# Patient Record
Sex: Male | Born: 1960 | Race: White | Hispanic: No | Marital: Married | Smoking: Never smoker
Health system: Southern US, Community
[De-identification: ages and names within clinical notes are randomized; demographics above are authoritative.]

## PROBLEM LIST (undated history)

## (undated) DIAGNOSIS — I10 Essential (primary) hypertension: Secondary | ICD-10-CM

## (undated) DIAGNOSIS — J302 Other seasonal allergic rhinitis: Secondary | ICD-10-CM

## (undated) DIAGNOSIS — G4730 Sleep apnea, unspecified: Secondary | ICD-10-CM

## (undated) DIAGNOSIS — K219 Gastro-esophageal reflux disease without esophagitis: Secondary | ICD-10-CM

## (undated) HISTORY — DX: Gastro-esophageal reflux disease without esophagitis: K21.9

## (undated) HISTORY — PX: KNEE SURGERY: SHX244

## (undated) HISTORY — PX: LIPOMA EXCISION: SHX5283

## (undated) HISTORY — DX: Other seasonal allergic rhinitis: J30.2

## (undated) HISTORY — DX: Essential (primary) hypertension: I10

## (undated) HISTORY — PX: ROTATOR CUFF REPAIR: SHX139

## (undated) HISTORY — DX: Sleep apnea, unspecified: G47.30

---

## 2004-11-02 ENCOUNTER — Other Ambulatory Visit: Admission: RE | Admit: 2004-11-02 | Discharge: 2004-11-02 | Payer: Self-pay | Admitting: Dermatology

## 2005-01-05 ENCOUNTER — Emergency Department (HOSPITAL_COMMUNITY): Admission: EM | Admit: 2005-01-05 | Discharge: 2005-01-06 | Payer: Self-pay | Admitting: Emergency Medicine

## 2005-04-20 ENCOUNTER — Emergency Department (HOSPITAL_COMMUNITY): Admission: EM | Admit: 2005-04-20 | Discharge: 2005-04-20 | Payer: Self-pay | Admitting: Emergency Medicine

## 2007-04-21 ENCOUNTER — Ambulatory Visit: Payer: Self-pay | Admitting: Gastroenterology

## 2007-04-21 LAB — CONVERTED CEMR LAB
ALT: 23 units/L (ref 0–53)
Basophils Absolute: 0.1 10*3/uL (ref 0.0–0.1)
CO2: 31 meq/L (ref 19–32)
Calcium: 9.1 mg/dL (ref 8.4–10.5)
Chloride: 109 meq/L (ref 96–112)
Eosinophils Absolute: 0.1 10*3/uL (ref 0.0–0.6)
GFR calc Af Amer: 93 mL/min
MCHC: 35 g/dL (ref 30.0–36.0)
MCV: 89.8 fL (ref 78.0–100.0)
Neutrophils Relative %: 63.8 % (ref 43.0–77.0)
RBC: 4.44 M/uL (ref 4.22–5.81)
WBC: 5.9 10*3/uL (ref 4.5–10.5)

## 2007-05-27 ENCOUNTER — Ambulatory Visit: Payer: Self-pay | Admitting: Gastroenterology

## 2007-10-28 DIAGNOSIS — T7840XA Allergy, unspecified, initial encounter: Secondary | ICD-10-CM | POA: Insufficient documentation

## 2007-10-28 DIAGNOSIS — K219 Gastro-esophageal reflux disease without esophagitis: Secondary | ICD-10-CM

## 2010-12-19 NOTE — Letter (Signed)
April 21, 2007    Mr. Daniel Giles   RE:  HASHIM, EICHHORST  MRN:  [id number]  /  DOB:  1960/11/11   Dear Mr. Daniel Giles:   It is my pleasure to have treated you recently as a new patient in my  office.  I appreciate your confidence and the opportunity to participate  in your care.   Since I do have a busy inpatient endoscopy schedule and office schedule,  my office hours vary weekly.  I am, however, available for emergency  calls every day through my office.  If I cannot promptly meet an urgent  office appointment, another one of our gastroenterologists will be able  to assist you.   My well-trained staff are prepared to help you at all times.  For  emergencies after office hours, a physician from our gastroenterology  section is always available through my 24-hour answering service.   While you are under my care, I encourage discussion of your questions  and concerns, and I will be happy to return your calls as soon as I am  available.   Once again, I welcome you as a new patient and I look forward to a happy  and healthy relationship.    Sincerely,      Barbette Hair. Arlyce Dice, MD,FACG  Electronically Signed   RDK/MedQ  DD: 04/21/2007  DT: 04/21/2007  Job #: [id number]

## 2010-12-19 NOTE — Assessment & Plan Note (Signed)
[facility]                         GASTROENTEROLOGY OFFICE NOTE   NAME:Vila, BASEL DEFALCO                      MRN:          [id number]  DATE:04/21/2007                            DOB:          Sep 11, 1960    PROBLEM:  Erratic bowel habits.   Mr. Gowdy is a 50 year old white male self-referred for evaluation.  Over the past year, he has had very erratic bowels consisting of  constipation followed by days of diarrhea. With diarrhea, he has severe  crampy lower abdominal pain. There is no history of melena or  hematochezia. Prior to a year ago, his bowels were normal. He also has  very frequent pyrosis.   FAMILY HISTORY:  Is pertinent for a sister who has Crohn's disease.  Father had colon polyps.   PAST MEDICAL HISTORY:  Is unremarkable.   FAMILY HISTORY:  Is as stated above.   CURRENT MEDICATIONS:  He takes Zantac daily.   ALLERGIES:  No allergies.   SOCIAL HISTORY:  He neither smokes nor drinks. He is married and works  as a Curator.   REVIEW OF SYSTEMS:  Was reviewed and is negative.   PHYSICAL EXAMINATION:  Pulse 76, blood pressure 110/66, weight 186.  HEENT: EOMI.  PERRLA.  Sclerae are anicteric.  Conjunctivae are pink.  NECK:  Supple without thyromegaly, adenopathy or carotid bruits.  CHEST:  Clear to auscultation and percussion without adventitious  sounds.  CARDIAC:  Regular rhythm; normal S1 S2.  There are no murmurs, gallops  or rubs.  ABDOMEN:  Bowel sounds are normoactive.  Abdomen is soft, nontender and  nondistended.  There are no abdominal masses, tenderness, splenic  enlargement or hepatomegaly.  EXTREMITIES:  Full range of motion.  No cyanosis, clubbing or edema.  RECTAL:  Deferred.   IMPRESSION:  1. Change in bowel habits. Inflammatory bowel disease is a concern.      Neoplasm is less likely. Finally, this may be irritable bowel      syndrome.  2. Gastroesophageal reflux disease.  3. Family history of Crohn's  disease.   RECOMMENDATION:  1. Colonoscopy. If negative, I will work on bowel regimen.  2. Begin Prevacid 30 mg a day.     Barbette Hair. Arlyce Dice, MD,FACG  Electronically Signed    RDK/MedQ  DD: 04/21/2007  DT: 04/21/2007  Job #: [id number]

## 2015-11-28 ENCOUNTER — Ambulatory Visit (INDEPENDENT_AMBULATORY_CARE_PROVIDER_SITE_OTHER): Payer: BLUE CROSS/BLUE SHIELD | Admitting: Primary Care

## 2015-11-28 ENCOUNTER — Encounter: Payer: Self-pay | Admitting: Primary Care

## 2015-11-28 VITALS — BP 152/104 | HR 61 | Temp 98.1°F | Ht 70.0 in | Wt 195.4 lb

## 2015-11-28 DIAGNOSIS — I10 Essential (primary) hypertension: Secondary | ICD-10-CM | POA: Insufficient documentation

## 2015-11-28 DIAGNOSIS — K219 Gastro-esophageal reflux disease without esophagitis: Secondary | ICD-10-CM

## 2015-11-28 MED ORDER — PANTOPRAZOLE SODIUM 40 MG PO TBEC: 40.0000 mg | DELAYED_RELEASE_TABLET | Freq: Every day | ORAL | Status: DC

## 2015-11-28 NOTE — Assessment & Plan Note (Signed)
Above goal in clinic, improved to 140/90 upon recheck. He is compliant to his medications. Managed on irbesartan 300 mg and is at max dose. Will have him check BP at home for 2 weeks. If BP above goal, then will add HCTZ.  Asymptomatic.

## 2015-11-28 NOTE — Assessment & Plan Note (Signed)
Long history of. Managed on pantoprazole 40 mg.  Will consider reducing dose to 20 mg next refill to see if he can tolerate.

## 2015-11-28 NOTE — Progress Notes (Signed)
   Subjective:    Patient ID: Daniel Giles, male    DOB: 1960/11/17, 55 y.o.   MRN: [id number]  HPI  Daniel Giles is a 55 year old male who presents today to establish care and discuss the problems mentioned below. Will obtain old records. His last physical was over 1 year ago.   1) Essential Hypertension: Diagnosed 2-3 years ago. Currently managed on irbesartan 300 mg. He does not currently monitor his BP at home. His BP is above goal in the office today, improved on recheck. Denies headaches, dizziness, chest pain.   2) GERD: Currently managed on pantoprazole 40 mg. He will experience belching, increased abdominal bloating and gas, and reflux of gastric content. Denies symptoms when on medication. He is aware of some trigger foods for GERD.   Review of Systems  Respiratory: Negative for shortness of breath.   Cardiovascular: Negative for chest pain.  Gastrointestinal:       GERD  Neurological: Negative for dizziness and headaches.       Past Medical History  Diagnosis Date  . GERD (gastroesophageal reflux disease)   . Essential hypertension   . Seasonal allergies      Social History   Social History  . Marital Status: Married    Spouse Name: N/A  . Number of Children: N/A  . Years of Education: N/A   Occupational History  . Not on file.   Social History Main Topics  . Smoking status: Never Smoker   . Smokeless tobacco: Not on file  . Alcohol Use: No  . Drug Use: No  . Sexual Activity: Not on file   Other Topics Concern  . Not on file   Social History Narrative   Married.   2 children, 4 grandchildren.   Works as a Dealer.   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       Past Surgical History  Procedure Laterality Date  . Rotator cuff repair Right   . Knee surgery Bilateral   . Lipoma excision      oral cavity    Family History  Problem Relation Age of Onset  . Hypertension Father   . Stroke Paternal Grandmother   . Diabetes  Brother     Type 1    Allergies  Allergen Reactions  . Codeine Itching    No current outpatient prescriptions on file prior to visit.   No current facility-administered medications on file prior to visit.    BP 152/104 mmHg  Pulse 61  Temp(Src) 98.1 F (36.7 C) (Oral)  Ht 5\' 10"  (1.778 m)  Wt 195 lb 6.4 oz (88.633 kg)  BMI 28.04 kg/m2  SpO2 97%    Objective:   Physical Exam  Constitutional: He is oriented to person, place, and time. He appears well-nourished.  Neck: Neck supple.  Cardiovascular: Normal rate and regular rhythm.   Pulmonary/Chest: Effort normal and breath sounds normal. He has no wheezes. He has no rales.  Neurological: He is alert and oriented to person, place, and time.  Skin: Skin is warm and dry.  Psychiatric: He has a normal mood and affect.          Assessment & Plan:

## 2015-11-28 NOTE — Progress Notes (Signed)
Pre visit review using our clinic review tool, if applicable. No additional management support is needed unless otherwise documented below in the visit note. 

## 2015-11-28 NOTE — Patient Instructions (Addendum)
Your blood pressure is too high today at 152/104 and 140/90 on recheck. Your blood pressure should be less than 140/90.  Check your blood pressure daily, around the same time of day, for the next 2 weeks.   Ensure that you have rested for 30 minutes prior to checking your blood pressure. Record your readings and bring them to your next visit.  I've sent a refill for the Pantoprazole medication to your pharmacy.   Please schedule a physical with me at your convenience. You may also schedule a lab only appointment 3-4 days prior. We will discuss your lab results in detail during your physical.  It was a pleasure to meet you today! Please don't hesitate to call me with any questions. Welcome to [facility]!  Food Choices for Gastroesophageal Reflux Disease, Adult When you have gastroesophageal reflux disease (GERD), the foods you eat and your eating habits are very important. Choosing the right foods can help ease the discomfort of GERD. WHAT GENERAL GUIDELINES DO I NEED TO FOLLOW?  Choose fruits, vegetables, whole grains, low-fat dairy products, and low-fat meat, fish, and poultry.  Limit fats such as oils, salad dressings, butter, nuts, and avocado.  Keep a food diary to identify foods that cause symptoms.  Avoid foods that cause reflux. These may be different for different people.  Eat frequent small meals instead of three large meals each day.  Eat your meals slowly, in a relaxed setting.  Limit fried foods.  Cook foods using methods other than frying.  Avoid drinking alcohol.  Avoid drinking large amounts of liquids with your meals.  Avoid bending over or lying down until 2-3 hours after eating. WHAT FOODS ARE NOT RECOMMENDED? The following are some foods and drinks that may worsen your symptoms: Vegetables Tomatoes. Tomato juice. Tomato and spaghetti sauce. Chili peppers. Onion and garlic. Horseradish. Fruits Oranges, grapefruit, and lemon (fruit and juice). Meats High-fat  meats, fish, and poultry. This includes hot dogs, ribs, ham, sausage, salami, and bacon. Dairy Whole milk and chocolate milk. Sour cream. Cream. Butter. Ice cream. Cream cheese.  Beverages Coffee and tea, with or without caffeine. Carbonated beverages or energy drinks. Condiments Hot sauce. Barbecue sauce.  Sweets/Desserts Chocolate and cocoa. Donuts. Peppermint and spearmint. Fats and Oils High-fat foods, including [place] fries and potato chips. Other Vinegar. Strong spices, such as black pepper, white pepper, red pepper, cayenne, curry powder, cloves, ginger, and chili powder. The items listed above may not be a complete list of foods and beverages to avoid. Contact your dietitian for more information.   This information is not intended to replace advice given to you by your health care provider. Make sure you discuss any questions you have with your health care provider.   Document Released: 07/23/2005 Document Revised: 08/13/2014 Document Reviewed: 05/27/2013 Elsevier Interactive Patient Education [employer].

## 2015-12-12 ENCOUNTER — Telehealth: Payer: Self-pay | Admitting: Primary Care

## 2015-12-12 NOTE — Telephone Encounter (Signed)
-----   Message from Pleas Koch, NP sent at 11/28/2015  2:34 PM EDT ----- Regarding: BP Please check on Daniel Giles blood pressure readings as he was to check his BP over the past 2 weeks.

## 2015-12-13 NOTE — Telephone Encounter (Signed)
Message left for patient to return my call.  

## 2015-12-15 NOTE — Telephone Encounter (Signed)
Left message on patient's voicemail to return call

## 2015-12-20 NOTE — Telephone Encounter (Signed)
Message left for patient to return my call.  

## 2016-02-17 ENCOUNTER — Other Ambulatory Visit: Payer: Self-pay | Admitting: Primary Care

## 2016-02-17 DIAGNOSIS — I10 Essential (primary) hypertension: Secondary | ICD-10-CM

## 2016-02-17 DIAGNOSIS — Z0000 Encounter for general adult medical examination without abnormal findings: Secondary | ICD-10-CM

## 2016-02-27 ENCOUNTER — Other Ambulatory Visit (INDEPENDENT_AMBULATORY_CARE_PROVIDER_SITE_OTHER): Payer: BLUE CROSS/BLUE SHIELD

## 2016-02-27 DIAGNOSIS — Z0000 Encounter for general adult medical examination without abnormal findings: Secondary | ICD-10-CM

## 2016-02-27 DIAGNOSIS — R7989 Other specified abnormal findings of blood chemistry: Secondary | ICD-10-CM | POA: Diagnosis not present

## 2016-02-27 DIAGNOSIS — I10 Essential (primary) hypertension: Secondary | ICD-10-CM

## 2016-02-27 LAB — COMPREHENSIVE METABOLIC PANEL
ALT: 19 U/L (ref 0–53)
AST: 15 U/L (ref 0–37)
Albumin: 4.3 g/dL (ref 3.5–5.2)
Alkaline Phosphatase: 64 U/L (ref 39–117)
BUN: 16 mg/dL (ref 6–23)
CALCIUM: 9.4 mg/dL (ref 8.4–10.5)
CHLORIDE: 105 meq/L (ref 96–112)
CO2: 31 meq/L (ref 19–32)
Creatinine, Ser: 1.29 mg/dL (ref 0.40–1.50)
GFR: 61.34 mL/min (ref >60.00)
Glucose, Bld: 109 mg/dL — ABNORMAL HIGH (ref 70–99)
Potassium: 4.3 mEq/L (ref 3.5–5.1)
Sodium: 142 mEq/L (ref 135–145)
Total Bilirubin: 0.5 mg/dL (ref 0.2–1.2)
Total Protein: 6.5 g/dL (ref 6.0–8.3)

## 2016-02-27 LAB — PSA: PSA: 0.26 ng/mL (ref 0.10–4.00)

## 2016-02-27 LAB — LIPID PANEL
CHOL/HDL RATIO: 6
CHOLESTEROL: 206 mg/dL — AB (ref 0–200)
HDL: 34.4 mg/dL — ABNORMAL LOW (ref >39.00)
NonHDL: 172.05
TRIGLYCERIDES: 236 mg/dL — AB (ref 0.0–149.0)
VLDL: 47.2 mg/dL — AB (ref 0.0–40.0)

## 2016-02-27 LAB — LDL CHOLESTEROL, DIRECT: Direct LDL: 139 mg/dL

## 2016-02-27 LAB — HEMOGLOBIN A1C: Hgb A1c MFr Bld: 5.5 % (ref 4.6–6.5)

## 2016-03-01 ENCOUNTER — Ambulatory Visit (INDEPENDENT_AMBULATORY_CARE_PROVIDER_SITE_OTHER): Payer: BLUE CROSS/BLUE SHIELD | Admitting: Primary Care

## 2016-03-01 ENCOUNTER — Encounter: Payer: Self-pay | Admitting: Primary Care

## 2016-03-01 VITALS — BP 130/86 | HR 54 | Temp 97.4°F | Ht 70.0 in | Wt 188.1 lb

## 2016-03-01 DIAGNOSIS — I10 Essential (primary) hypertension: Secondary | ICD-10-CM

## 2016-03-01 DIAGNOSIS — K219 Gastro-esophageal reflux disease without esophagitis: Secondary | ICD-10-CM | POA: Diagnosis not present

## 2016-03-01 DIAGNOSIS — E785 Hyperlipidemia, unspecified: Secondary | ICD-10-CM

## 2016-03-01 DIAGNOSIS — Z0000 Encounter for general adult medical examination without abnormal findings: Secondary | ICD-10-CM | POA: Insufficient documentation

## 2016-03-01 DIAGNOSIS — Z0001 Encounter for general adult medical examination with abnormal findings: Secondary | ICD-10-CM | POA: Insufficient documentation

## 2016-03-01 MED ORDER — RANITIDINE HCL 150 MG PO TABS: 150.0000 mg | ORAL_TABLET | Freq: Every day | ORAL | 3 refills | Status: DC

## 2016-03-01 NOTE — Progress Notes (Signed)
Subjective:    Patient ID: Daniel Giles, male    DOB: [birth year], 55 y.o.   MRN: [id number]  HPI  Daniel Giles is a 55 year old male who presents today for complete physical.  Immunizations: -Tetanus: Unsure, believes it's been over 10 years -Influenza: Did receive last season  Diet: He endorses a fair diet Breakfast: Egg biscuit Lunch: Meat, vegetables, potatoes  Dinner: Meat, vegetables, potatoes Snacks: Occasionally Desserts: Occasionally  Beverages: Water, soda  Exercise: He does not currently exercise Eye exam: Completed 2 years ago, due Dental exam: Completes semi-annually Colonoscopy: Completed in 2008, normal.   Review of Systems  Constitutional: Negative for unexpected weight change.  HENT: Negative for rhinorrhea.   Respiratory: Negative for cough and shortness of breath.   Cardiovascular: Negative for chest pain.  Gastrointestinal: Negative for constipation and diarrhea.  Genitourinary: Negative for difficulty urinating.  Musculoskeletal: Negative for arthralgias and myalgias.  Skin: Negative for rash.  Allergic/Immunologic: Positive for environmental allergies.  Neurological: Negative for dizziness, numbness and headaches.  Psychiatric/Behavioral:       Denies concerns for anxiety and depression       Past Medical History:  Diagnosis Date  . Essential hypertension   . GERD (gastroesophageal reflux disease)   . Seasonal allergies      Social History   Social History  . Marital status: Married    Spouse name: N/A  . Number of children: N/A  . Years of education: N/A   Occupational History  . Not on file.   Social History Main Topics  . Smoking status: Never Smoker  . Smokeless tobacco: Not on file  . Alcohol use No  . Drug use: No  . Sexual activity: Not on file   Other Topics Concern  . Not on file   Social History Narrative   Married.   2 children, 4 grandchildren.   Works as a Dealer.   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       Past Surgical History:  Procedure Laterality Date  . KNEE SURGERY Bilateral   . LIPOMA EXCISION     oral cavity  . ROTATOR CUFF REPAIR Right     Family History  Problem Relation Age of Onset  . Hypertension Father   . Stroke Paternal Grandmother   . Diabetes Brother     Type 1    Allergies  Allergen Reactions  . Codeine Itching    Current Outpatient Prescriptions on File Prior to Visit  Medication Sig Dispense Refill  . irbesartan (AVAPRO) 300 MG tablet Take 300 mg by mouth daily.     No current facility-administered medications on file prior to visit.     BP 130/86 (BP Location: Right Arm, Patient Position: Sitting, Cuff Size: Normal)   Pulse (!) 54   Temp 97.4 F (36.3 C) (Oral)   Ht 5\' 10"  (1.778 m)   Wt 188 lb 1.9 oz (85.3 kg)   SpO2 98%   BMI 26.99 kg/m    Objective:   Physical Exam  Constitutional: He is oriented to person, place, and time. He appears well-nourished.  HENT:  Right Ear: Tympanic membrane and ear canal normal.  Left Ear: Tympanic membrane and ear canal normal.  Nose: Nose normal. Right sinus exhibits no maxillary sinus tenderness and no frontal sinus tenderness. Left sinus exhibits no maxillary sinus tenderness and no frontal sinus tenderness.  Mouth/Throat: Oropharynx is clear and moist.  Eyes: Conjunctivae and EOM are normal. Pupils are equal, round,  and reactive to light.  Neck: Neck supple. Carotid bruit is not present. No thyromegaly present.  Cardiovascular: Normal rate, regular rhythm and normal heart sounds.   Pulmonary/Chest: Effort normal and breath sounds normal. He has no wheezes. He has no rales.  Abdominal: Soft. Bowel sounds are normal. There is no tenderness. A hernia is present.  Small, mid upper abdominal hernia. Non tender. Not bothersome.  Musculoskeletal: Normal range of motion.  Neurological: He is alert and oriented to person, place, and time. He has normal reflexes. No cranial nerve  deficit.  Skin: Skin is warm and dry.  Psychiatric: He has a normal mood and affect.          Assessment & Plan:

## 2016-03-01 NOTE — Assessment & Plan Note (Signed)
Stable in clinic today. Continue current regimen.

## 2016-03-01 NOTE — Assessment & Plan Note (Signed)
Has been taking Zantac with improvement. Stop pantoprazole, Zantac sent to pharmacy.

## 2016-03-01 NOTE — Patient Instructions (Addendum)
Your cholesterol is slightly too high. Your triglycerides are too high. It is important that you improve your diet. Please limit carbohydrates in the form of white bread, potatoes, fried foods, fatty foods, etc. Increase your consumption of fresh fruits and vegetables, whole grains, lean protein.  Start Fish Oil 1000 mg capsules once daily with a meal to help lower your triglycerides.  Ensure you are consuming 64 ounces of water daily.  Start exercising. You should be getting 1 hour of moderate intensity exercise 5 days weekly.  You were provided with a tetanus vaccination that will cover you for 10 years.  Start Zantac 150 mg tablets for acid reflux. Take 1 tablet by mouth once daily. Stop Pantoprazole.  Follow up in 1 year for repeat physical or sooner if needed.  It was a pleasure to see you today!  Food Choices to Lower Your Triglycerides Triglycerides are a type of fat in your blood. High levels of triglycerides can increase the risk of heart disease and stroke. If your triglyceride levels are high, the foods you eat and your eating habits are very important. Choosing the right foods can help lower your triglycerides.  WHAT GENERAL GUIDELINES DO I NEED TO FOLLOW?  Lose weight if you are overweight.   Limit or avoid alcohol.   Fill one half of your plate with vegetables and green salads.   Limit fruit to two servings a day. Choose fruit instead of juice.   Make one fourth of your plate whole grains. Look for the word "whole" as the first word in the ingredient list.  Fill one fourth of your plate with lean protein foods.  Enjoy fatty fish (such as salmon, mackerel, sardines, and tuna) three times a week.   Choose healthy fats.   Limit foods high in starch and sugar.  Eat more home-cooked food and less restaurant, buffet, and fast food.  Limit fried foods.  Cook foods using methods other than frying.  Limit saturated fats.  Check ingredient lists to avoid foods  with partially hydrogenated oils (trans fats) in them. WHAT FOODS CAN I EAT?  Grains Whole grains, such as whole wheat or whole grain breads, crackers, cereals, and pasta. Unsweetened oatmeal, bulgur, barley, quinoa, or brown rice. Corn or whole wheat flour tortillas.  Vegetables Fresh or frozen vegetables (raw, steamed, roasted, or grilled). Green salads. Fruits All fresh, canned (in natural juice), or frozen fruits. Meat and Other Protein Products Ground beef (85% or leaner), grass-fed beef, or beef trimmed of fat. Skinless chicken or [place]. Ground chicken or [place]. Pork trimmed of fat. All fish and seafood. Eggs. Dried beans, peas, or lentils. Unsalted nuts or seeds. Unsalted canned or dry beans. Dairy Low-fat dairy products, such as skim or 1% milk, 2% or reduced-fat cheeses, low-fat ricotta or cottage cheese, or plain low-fat yogurt. Fats and Oils Tub margarines without trans fats. Light or reduced-fat mayonnaise and salad dressings. Avocado. Safflower, olive, or canola oils. Natural peanut or almond butter. The items listed above may not be a complete list of recommended foods or beverages. Contact your dietitian for more options. WHAT FOODS ARE NOT RECOMMENDED?  Grains White bread. White pasta. White rice. Cornbread. Bagels, pastries, and croissants. Crackers that contain trans fat. Vegetables White potatoes. Corn. Creamed or fried vegetables. Vegetables in a cheese sauce. Fruits Dried fruits. Canned fruit in light or heavy syrup. Fruit juice. Meat and Other Protein Products Fatty cuts of meat. Ribs, chicken wings, bacon, sausage, bologna, salami, chitterlings, fatback, hot dogs, bratwurst,  and packaged luncheon meats. Dairy Whole or 2% milk, cream, half-and-half, and cream cheese. Whole-fat or sweetened yogurt. Full-fat cheeses. Nondairy creamers and whipped toppings. Processed cheese, cheese spreads, or cheese curds. Sweets and Desserts Corn syrup, sugars, honey, and molasses.  Candy. Jam and jelly. Syrup. Sweetened cereals. Cookies, pies, cakes, donuts, muffins, and ice cream. Fats and Oils Butter, stick margarine, lard, shortening, ghee, or bacon fat. Coconut, palm kernel, or palm oils. Beverages Alcohol. Sweetened drinks (such as sodas, lemonade, and fruit drinks or punches). The items listed above may not be a complete list of foods and beverages to avoid. Contact your dietitian for more information.   This information is not intended to replace advice given to you by your health care provider. Make sure you discuss any questions you have with your health care provider.   Document Released: 05/10/2004 Document Revised: 08/13/2014 Document Reviewed: 05/27/2013 Elsevier Interactive Patient Education [employer].

## 2016-03-01 NOTE — Progress Notes (Signed)
Pre visit review using our clinic review tool, if applicable. No additional management support is needed unless otherwise documented below in the visit note. 

## 2016-03-01 NOTE — Assessment & Plan Note (Signed)
TC, LDL, and Trigs above goal. Will have him work on healthy lifestyle changes for 6 months. Start Fish Oil 1000 mg daily. Recheck lipids in 6 months.

## 2016-03-01 NOTE — Assessment & Plan Note (Signed)
Tetanus due, provided today. Colonoscopy due next year. PSA stable, repeat in 2-3 years. Discussed the importance of a healthy diet and regular exercise in order for weight loss and to reduce risk of other medical diseases. Exam unremarkable. Labs with hyperlipidemia. Will recheck in 6 months. Discussed options for treatment. Form for work completed today. Follow up in 1 year.

## 2016-04-24 ENCOUNTER — Other Ambulatory Visit: Payer: Self-pay

## 2016-04-24 MED ORDER — IRBESARTAN 300 MG PO TABS: 300.0000 mg | ORAL_TABLET | Freq: Every day | ORAL | 3 refills | Status: DC

## 2016-04-24 NOTE — Telephone Encounter (Signed)
Daniel Giles request refill irbesartan to [facility] [address]. Pt last seen 03/01/16. Daniel Giles advised done as requested.

## 2016-08-28 ENCOUNTER — Other Ambulatory Visit: Payer: Self-pay | Admitting: Primary Care

## 2016-08-28 DIAGNOSIS — E785 Hyperlipidemia, unspecified: Secondary | ICD-10-CM

## 2016-09-03 ENCOUNTER — Other Ambulatory Visit: Payer: BLUE CROSS/BLUE SHIELD

## 2017-01-23 ENCOUNTER — Ambulatory Visit (INDEPENDENT_AMBULATORY_CARE_PROVIDER_SITE_OTHER): Payer: BLUE CROSS/BLUE SHIELD | Admitting: Primary Care

## 2017-01-23 ENCOUNTER — Encounter: Payer: Self-pay | Admitting: Primary Care

## 2017-01-23 ENCOUNTER — Encounter (INDEPENDENT_AMBULATORY_CARE_PROVIDER_SITE_OTHER): Payer: Self-pay

## 2017-01-23 VITALS — BP 110/72 | HR 59 | Temp 98.0°F | Wt 185.8 lb

## 2017-01-23 DIAGNOSIS — R252 Cramp and spasm: Secondary | ICD-10-CM

## 2017-01-23 LAB — BASIC METABOLIC PANEL
BUN: 18 mg/dL (ref 6–23)
CALCIUM: 9.3 mg/dL (ref 8.4–10.5)
CO2: 27 mEq/L (ref 19–32)
Chloride: 106 mEq/L (ref 96–112)
Creatinine, Ser: 1.2 mg/dL (ref 0.40–1.50)
GFR: 66.46 mL/min (ref >60.00)
Glucose, Bld: 104 mg/dL — ABNORMAL HIGH (ref 70–99)
Potassium: 4.2 mEq/L (ref 3.5–5.1)
SODIUM: 141 meq/L (ref 135–145)

## 2017-01-23 LAB — MAGNESIUM: MAGNESIUM: 1.9 mg/dL (ref 1.5–2.5)

## 2017-01-23 NOTE — Addendum Note (Signed)
Addended by: Daralene Milch C on: 01/23/2017 12:30 PM   Modules accepted: Orders

## 2017-01-23 NOTE — Progress Notes (Signed)
   Subjective:    Patient ID: Daniel Giles, male    DOB: [birth year], 56 y.o.   MRN: [id number]  HPI  Daniel Giles is a 56 year old male who presents today with a chief complaint of lower extremity leg cramping. His cramping is located to the posterior thighs bilaterally. He will experience cramps with activity (standing, walking, stepping up). He denies back pain, weakness, fatigue. His cramping began 6-7 months ago. He drinks 2-3 bottles of water daily, soda, occasional Gatorade. Over the past 1-2 months the cramping has become much stronger.   Review of Systems  Constitutional: Negative for fatigue.  Musculoskeletal: Positive for myalgias. Negative for back pain and joint swelling.  Neurological: Negative for weakness and headaches.       Past Medical History:  Diagnosis Date  . Essential hypertension   . GERD (gastroesophageal reflux disease)   . Seasonal allergies      Social History   Social History  . Marital status: Married    Spouse name: N/A  . Number of children: N/A  . Years of education: N/A   Occupational History  . Not on file.   Social History Main Topics  . Smoking status: Never Smoker  . Smokeless tobacco: Never Used  . Alcohol use No  . Drug use: No  . Sexual activity: Not on file   Other Topics Concern  . Not on file   Social History Narrative   Married.   2 children, 4 grandchildren.   Works as a Dealer.   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       Past Surgical History:  Procedure Laterality Date  . KNEE SURGERY Bilateral   . LIPOMA EXCISION     oral cavity  . ROTATOR CUFF REPAIR Right     Family History  Problem Relation Age of Onset  . Hypertension Father   . Stroke Paternal Grandmother   . Diabetes Brother        Type 1    Allergies  Allergen Reactions  . Codeine Itching    Current Outpatient Prescriptions on File Prior to Visit  Medication Sig Dispense Refill  . irbesartan (AVAPRO) 300 MG tablet  Take 1 tablet (300 mg total) by mouth daily. 90 tablet 3  . ranitidine (ZANTAC) 150 MG tablet Take 1 tablet (150 mg total) by mouth daily. 90 tablet 3   No current facility-administered medications on file prior to visit.     BP 110/72 (BP Location: Left Arm, Patient Position: Sitting, Cuff Size: Normal)   Pulse (!) 59   Temp 98 F (36.7 C) (Oral)   Wt 185 lb 12 oz (84.3 kg)   SpO2 96%   BMI 26.65 kg/m    Objective:   Physical Exam  Constitutional: He appears well-nourished.  Neck: Neck supple.  Cardiovascular: Normal rate and regular rhythm.   Pulmonary/Chest: Effort normal and breath sounds normal.  Musculoskeletal: Normal range of motion.  5/5 strength to bilateral lower extremities  Skin: Skin is warm and dry.          Assessment & Plan:

## 2017-01-23 NOTE — Patient Instructions (Signed)
Complete lab work prior to leaving today. I will notify you of your results once received.   We may switch your blood pressure medication in the future, but I'll be in touch once I receive your results.  Ensure you are consuming 64 ounces of water daily.  It was a pleasure to see you today!  Leg Cramps Leg cramps occur when a muscle or muscles tighten and you have no control over this tightening (involuntary muscle contraction). Muscle cramps can develop in any muscle, but the most common place is in the calf muscles of the leg. Those cramps can occur during exercise or when you are at rest. Leg cramps are painful, and they may last for a few seconds to a few minutes. Cramps may return several times before they finally stop. Usually, leg cramps are not caused by a serious medical problem. In many cases, the cause is not known. Some common causes include:  Overexertion.  Overuse from repetitive motions, or doing the same thing over and over.  Remaining in a certain position for a long period of time.  Improper preparation, form, or technique while performing a sport or an activity.  Dehydration.  Injury.  Side effects of some medicines.  Abnormally low levels of the salts and ions in your blood (electrolytes), especially potassium and calcium. These levels could be low if you are taking water pills (diuretics) or if you are pregnant.  Follow these instructions at home: Watch your condition for any changes. Taking the following actions may help to lessen any discomfort that you are feeling:  Stay well-hydrated. Drink enough fluid to keep your urine clear or pale yellow.  Try massaging, stretching, and relaxing the affected muscle. Do this for several minutes at a time.  For tight or tense muscles, use a warm towel, heating pad, or hot shower water directed to the affected area.  If you are sore or have pain after a cramp, applying ice to the affected area may relieve  discomfort. ? Put ice in a plastic bag. ? Place a towel between your skin and the bag. ? Leave the ice on for 20 minutes, 2-3 times per day.  Avoid strenuous exercise for several days if you have been having frequent leg cramps.  Make sure that your diet includes the essential minerals for your muscles to work normally.  Take medicines only as directed by your health care provider.  Contact a health care provider if:  Your leg cramps get more severe or more frequent, or they do not improve over time.  Your foot becomes cold, numb, or blue. This information is not intended to replace advice given to you by your health care provider. Make sure you discuss any questions you have with your health care provider. Document Released: 08/30/2004 Document Revised: 12/29/2015 Document Reviewed: 06/30/2014 Elsevier Interactive Patient Education  [employer].

## 2017-01-23 NOTE — Assessment & Plan Note (Signed)
Located to bilateral thighs x 6-7 months. Could be electrolyte imbalance, dehydration. Will check BMP, magnesium, CBC today. Consider changing Avapro, <1% chance of muscle cramping as a side effect. Await labs.

## 2017-01-24 LAB — CBC
HCT: 40.6 % (ref 39.0–52.0)
HEMOGLOBIN: 13.8 g/dL (ref 13.0–17.0)
MCHC: 33.9 g/dL (ref 30.0–36.0)
MCV: 90.6 fl (ref 78.0–100.0)
PLATELETS: 176 10*3/uL (ref 150.0–400.0)
RBC: 4.48 Mil/uL (ref 4.22–5.81)
RDW: 13.6 % (ref 11.5–15.5)
WBC: 8.4 10*3/uL (ref 4.0–10.5)

## 2017-02-01 ENCOUNTER — Other Ambulatory Visit: Payer: Self-pay | Admitting: *Deleted

## 2017-02-01 MED ORDER — AMLODIPINE BESYLATE 10 MG PO TABS: 10.0000 mg | ORAL_TABLET | Freq: Every day | ORAL | 1 refills | Status: DC

## 2017-02-15 ENCOUNTER — Telehealth: Payer: Self-pay | Admitting: Primary Care

## 2017-02-15 DIAGNOSIS — I10 Essential (primary) hypertension: Secondary | ICD-10-CM

## 2017-02-15 NOTE — Telephone Encounter (Signed)
Per DPR, left detail message of Kate's comments for patient to call back. 

## 2017-02-15 NOTE — Telephone Encounter (Signed)
Pt wife said pt is having severe headaches since changing his BP meds.

## 2017-02-15 NOTE — Telephone Encounter (Signed)
Please notify patient to stop Amlodipine 10 mg given that this is likely the cause of his headaches. How are his leg cramps? Any better? What's his BP running?   Will switch him to HCTZ 25 mg and have him monitor his BP. I'd like to see him back in the office in 2-3 weeks for BP check and BMP given that he will be on HCTZ. Please notify me if he's agreeable to the HCTZ and I'll send it to his pharmacy.

## 2017-02-19 NOTE — Telephone Encounter (Signed)
Per DPR, left detail message of Daniel Giles's comments for patient to call back. 

## 2017-02-20 MED ORDER — HYDROCHLOROTHIAZIDE 25 MG PO TABS: 25.0000 mg | ORAL_TABLET | Freq: Every day | ORAL | 0 refills | Status: DC

## 2017-02-20 NOTE — Telephone Encounter (Signed)
Noted. Rx for HCTZ 25 mg sent to pharmacy.

## 2017-02-20 NOTE — Telephone Encounter (Addendum)
Patient called back. Patient stated no leg cramps just the headaches. Have not check BP recently. Follow up has been schedule on 03/21/17 because this is when patient comes back from vacation.  Patient is agreeable to the HCTZ. Please send to [facility].

## 2017-02-20 NOTE — Addendum Note (Signed)
Addended by: Pleas Koch on: 02/20/2017 05:18 PM   Modules accepted: Orders

## 2017-03-09 ENCOUNTER — Other Ambulatory Visit: Payer: Self-pay | Admitting: Primary Care

## 2017-03-09 DIAGNOSIS — K219 Gastro-esophageal reflux disease without esophagitis: Secondary | ICD-10-CM

## 2017-03-14 ENCOUNTER — Encounter: Payer: BLUE CROSS/BLUE SHIELD | Admitting: Primary Care

## 2017-03-19 ENCOUNTER — Other Ambulatory Visit: Payer: Self-pay | Admitting: Primary Care

## 2017-03-19 DIAGNOSIS — I10 Essential (primary) hypertension: Secondary | ICD-10-CM

## 2017-03-21 ENCOUNTER — Ambulatory Visit (INDEPENDENT_AMBULATORY_CARE_PROVIDER_SITE_OTHER): Payer: BLUE CROSS/BLUE SHIELD | Admitting: Primary Care

## 2017-03-21 ENCOUNTER — Encounter: Payer: Self-pay | Admitting: Primary Care

## 2017-03-21 VITALS — BP 118/80 | HR 84 | Temp 97.7°F | Ht 70.0 in | Wt 186.4 lb

## 2017-03-21 DIAGNOSIS — I10 Essential (primary) hypertension: Secondary | ICD-10-CM | POA: Diagnosis not present

## 2017-03-21 DIAGNOSIS — E785 Hyperlipidemia, unspecified: Secondary | ICD-10-CM

## 2017-03-21 DIAGNOSIS — R252 Cramp and spasm: Secondary | ICD-10-CM | POA: Diagnosis not present

## 2017-03-21 LAB — COMPREHENSIVE METABOLIC PANEL
ALBUMIN: 4.1 g/dL (ref 3.5–5.2)
ALK PHOS: 49 U/L (ref 39–117)
ALT: 22 U/L (ref 0–53)
AST: 19 U/L (ref 0–37)
BILIRUBIN TOTAL: 0.7 mg/dL (ref 0.2–1.2)
BUN: 19 mg/dL (ref 6–23)
CO2: 32 mEq/L (ref 19–32)
Calcium: 9.6 mg/dL (ref 8.4–10.5)
Chloride: 102 mEq/L (ref 96–112)
Creatinine, Ser: 1.28 mg/dL (ref 0.40–1.50)
GFR: 61.65 mL/min (ref >60.00)
GLUCOSE: 103 mg/dL — AB (ref 70–99)
Potassium: 3.7 mEq/L (ref 3.5–5.1)
Sodium: 139 mEq/L (ref 135–145)
TOTAL PROTEIN: 6.5 g/dL (ref 6.0–8.3)

## 2017-03-21 LAB — LIPID PANEL
Cholesterol: 194 mg/dL (ref 0–200)
HDL: 38 mg/dL — AB (ref >39.00)
LDL Cholesterol: 134 mg/dL — ABNORMAL HIGH (ref 0–99)
NONHDL: 156.48
Total CHOL/HDL Ratio: 5
Triglycerides: 112 mg/dL (ref 0.0–149.0)
VLDL: 22.4 mg/dL (ref 0.0–40.0)

## 2017-03-21 MED ORDER — HYDROCHLOROTHIAZIDE 25 MG PO TABS: 25.0000 mg | ORAL_TABLET | Freq: Every day | ORAL | 3 refills | Status: DC

## 2017-03-21 NOTE — Progress Notes (Signed)
   Subjective:    Patient ID: Daniel Giles, male    DOB: 27-Jan-1961, 56 y.o.   MRN: [id number]  HPI  [name] is a 56 year old male who presents today for follow up of hypertension. He was originally managed on Avapro but this caused leg cramps so he was switched to Amlodipine. The Amlodipine caused headaches so he was switched to HCTZ.   Since initiation of HCTZ his blood pressure is 118/80. He denies headaches, leg cramping, dizziness, chest pain, visual changes. He's checking his blood pressure at home and is getting similar readings.  Review of Systems  Constitutional: Negative for fatigue.  Eyes: Negative for visual disturbance.  Respiratory: Negative for shortness of breath.   Cardiovascular: Negative for chest pain.  Musculoskeletal: Negative for myalgias.  Neurological: Negative for dizziness and headaches.       Past Medical History:  Diagnosis Date  . Essential hypertension   . GERD (gastroesophageal reflux disease)   . Seasonal allergies      Social History   Social History  . Marital status: Married    Spouse name: N/A  . Number of children: N/A  . Years of education: N/A   Occupational History  . Not on file.   Social History Main Topics  . Smoking status: Never Smoker  . Smokeless tobacco: Never Used  . Alcohol use No  . Drug use: No  . Sexual activity: Not on file   Other Topics Concern  . Not on file   Social History Narrative   Married.   2 children, 4 grandchildren.   Works as a Dealer.   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       Past Surgical History:  Procedure Laterality Date  . KNEE SURGERY Bilateral   . LIPOMA EXCISION     oral cavity  . ROTATOR CUFF REPAIR Right     Family History  Problem Relation Age of Onset  . Hypertension Father   . Stroke Paternal Grandmother   . Diabetes Brother        Type 1    Allergies  Allergen Reactions  . Codeine Itching    Current Outpatient Prescriptions on  File Prior to Visit  Medication Sig Dispense Refill  . ranitidine (ZANTAC) 150 MG tablet TAKE 1 TABLET (150 MG TOTAL) BY MOUTH DAILY. 90 tablet 3   No current facility-administered medications on file prior to visit.     BP 118/80   Pulse 84   Temp 97.7 F (36.5 C) (Oral)   Ht 5\' 10"  (1.778 m)   Wt 186 lb 6.4 oz (84.6 kg)   SpO2 98%   BMI 26.75 kg/m    Objective:   Physical Exam  Constitutional: He appears well-nourished.  Neck: Neck supple.  Cardiovascular: Normal rate and regular rhythm.   Pulmonary/Chest: Effort normal and breath sounds normal.  Skin: Skin is warm and dry.          Assessment & Plan:

## 2017-03-21 NOTE — Assessment & Plan Note (Signed)
Completely dissipated since coming off of Avapro.

## 2017-03-21 NOTE — Assessment & Plan Note (Signed)
Due for repeat lipids today, pending.

## 2017-03-21 NOTE — Assessment & Plan Note (Signed)
Doing well on HCTZ, no complaints of headaches, extremity cramping, chest pain. Refills provided. BMP pending. Continue same.

## 2017-03-21 NOTE — Patient Instructions (Signed)
Complete lab work prior to leaving today. I will notify you of your results once received.   Continue hydrochlorothiazide 25 mg tablets for blood pressure.  It was a pleasure to see you today!

## 2017-03-22 ENCOUNTER — Encounter: Payer: Self-pay | Admitting: *Deleted

## 2017-04-19 ENCOUNTER — Encounter: Payer: BLUE CROSS/BLUE SHIELD | Admitting: Primary Care

## 2017-04-25 ENCOUNTER — Encounter: Payer: BLUE CROSS/BLUE SHIELD | Admitting: Primary Care

## 2017-06-25 ENCOUNTER — Other Ambulatory Visit: Payer: Self-pay | Admitting: Primary Care

## 2017-06-25 DIAGNOSIS — I10 Essential (primary) hypertension: Secondary | ICD-10-CM

## 2017-06-25 MED ORDER — HYDROCHLOROTHIAZIDE 25 MG PO TABS: 25.0000 mg | ORAL_TABLET | Freq: Every day | ORAL | 1 refills | Status: DC

## 2017-07-15 ENCOUNTER — Other Ambulatory Visit: Payer: Self-pay | Admitting: Primary Care

## 2018-03-12 ENCOUNTER — Other Ambulatory Visit: Payer: Self-pay | Admitting: Primary Care

## 2018-03-12 DIAGNOSIS — K219 Gastro-esophageal reflux disease without esophagitis: Secondary | ICD-10-CM

## 2018-04-14 ENCOUNTER — Other Ambulatory Visit: Payer: Self-pay | Admitting: Primary Care

## 2018-04-14 DIAGNOSIS — K219 Gastro-esophageal reflux disease without esophagitis: Secondary | ICD-10-CM

## 2018-05-05 ENCOUNTER — Other Ambulatory Visit: Payer: Self-pay | Admitting: Primary Care

## 2018-05-05 DIAGNOSIS — K219 Gastro-esophageal reflux disease without esophagitis: Secondary | ICD-10-CM

## 2018-05-06 NOTE — Telephone Encounter (Signed)
Please call patient and schedule an appointment. Last office visit 03/21/17. Send back for refill after appointment is scheduled.

## 2018-05-07 NOTE — Telephone Encounter (Signed)
Scheduled appt 10/16 at 12:00

## 2018-05-08 NOTE — Telephone Encounter (Signed)
Noted, refill sent to pharmacy. 

## 2018-05-21 ENCOUNTER — Encounter (INDEPENDENT_AMBULATORY_CARE_PROVIDER_SITE_OTHER): Payer: Self-pay

## 2018-05-21 ENCOUNTER — Ambulatory Visit (INDEPENDENT_AMBULATORY_CARE_PROVIDER_SITE_OTHER): Payer: BLUE CROSS/BLUE SHIELD | Admitting: Primary Care

## 2018-05-21 ENCOUNTER — Encounter: Payer: Self-pay | Admitting: Primary Care

## 2018-05-21 VITALS — BP 126/80 | HR 58 | Temp 97.8°F | Ht 70.0 in | Wt 186.5 lb

## 2018-05-21 DIAGNOSIS — Z23 Encounter for immunization: Secondary | ICD-10-CM | POA: Diagnosis not present

## 2018-05-21 DIAGNOSIS — E785 Hyperlipidemia, unspecified: Secondary | ICD-10-CM

## 2018-05-21 DIAGNOSIS — K219 Gastro-esophageal reflux disease without esophagitis: Secondary | ICD-10-CM | POA: Diagnosis not present

## 2018-05-21 DIAGNOSIS — I10 Essential (primary) hypertension: Secondary | ICD-10-CM

## 2018-05-21 DIAGNOSIS — Z0000 Encounter for general adult medical examination without abnormal findings: Secondary | ICD-10-CM

## 2018-05-21 LAB — LIPID PANEL
CHOLESTEROL: 223 mg/dL — AB (ref 0–200)
HDL: 42 mg/dL (ref >39.00)
LDL Cholesterol: 157 mg/dL — ABNORMAL HIGH (ref 0–99)
NonHDL: 180.59
Total CHOL/HDL Ratio: 5
Triglycerides: 120 mg/dL (ref 0.0–149.0)
VLDL: 24 mg/dL (ref 0.0–40.0)

## 2018-05-21 LAB — COMPREHENSIVE METABOLIC PANEL
ALBUMIN: 4.7 g/dL (ref 3.5–5.2)
ALT: 25 U/L (ref 0–53)
AST: 20 U/L (ref 0–37)
Alkaline Phosphatase: 57 U/L (ref 39–117)
BILIRUBIN TOTAL: 0.8 mg/dL (ref 0.2–1.2)
BUN: 18 mg/dL (ref 6–23)
CO2: 31 meq/L (ref 19–32)
CREATININE: 1.33 mg/dL (ref 0.40–1.50)
Calcium: 10 mg/dL (ref 8.4–10.5)
Chloride: 102 mEq/L (ref 96–112)
GFR: 58.74 mL/min — ABNORMAL LOW (ref >60.00)
Glucose, Bld: 94 mg/dL (ref 70–99)
Potassium: 4.6 mEq/L (ref 3.5–5.1)
SODIUM: 140 meq/L (ref 135–145)
TOTAL PROTEIN: 7.5 g/dL (ref 6.0–8.3)

## 2018-05-21 LAB — HEMOGLOBIN A1C: Hgb A1c MFr Bld: 5.7 % (ref 4.6–6.5)

## 2018-05-21 MED ORDER — HYDROCHLOROTHIAZIDE 25 MG PO TABS: ORAL_TABLET | ORAL | 3 refills | Status: DC

## 2018-05-21 MED ORDER — RANITIDINE HCL 150 MG PO TABS: ORAL_TABLET | ORAL | 3 refills | Status: DC

## 2018-05-21 NOTE — Addendum Note (Signed)
Addended by: Jacqualin Combes on: 05/21/2018 03:07 PM   Modules accepted: Orders

## 2018-05-21 NOTE — Assessment & Plan Note (Signed)
Doing well on Zantac for which he takes 1-2 times daily. Continue same. Refills sent to pharmacy.

## 2018-05-21 NOTE — Progress Notes (Signed)
Subjective:    Patient ID: Daniel Giles, male    DOB: Dec 27, 1960, 57 y.o.   MRN: [id number]  HPI  Daniel Giles is a 57 year old male who presents today for complete physical.  BP Readings from Last 3 Encounters:  05/21/18 126/80  03/21/17 118/80  01/23/17 110/72     Immunizations: -Tetanus: Completed 10 years ago -Influenza: Due   Diet: He endorses a fair diet. 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  HENT: Negative for rhinorrhea.   Respiratory: Negative for cough and shortness of breath.   Cardiovascular: Negative for chest pain.  Gastrointestinal: Negative for constipation and diarrhea.  Genitourinary: Negative for difficulty urinating.  Musculoskeletal: Negative for arthralgias.  Skin: Negative for rash.  Allergic/Immunologic: Negative for environmental allergies.  Neurological: Negative for dizziness, numbness and headaches.  Psychiatric/Behavioral: The patient is not nervous/anxious.        Past Medical History:  Diagnosis Date  . Essential hypertension   . GERD (gastroesophageal reflux disease)   . Seasonal allergies      Social History   Socioeconomic History  . Marital status: Married    Spouse name: Not on file  . Number of children: Not on file  . Years of education: Not on file  . Highest education level: Not on file  Occupational History  . Not on file  Social Needs  . Financial resource strain: Not on file  . Food insecurity:    Worry: Not on file    Inability: Not on file  . Transportation needs:    Medical: Not on file   Non-medical: Not on file  Tobacco Use  . Smoking status: Never Smoker  . Smokeless tobacco: Never Used  Substance and Sexual Activity  . Alcohol use: No    Alcohol/week: 0.0 standard drinks  . Drug use: No  . Sexual activity: Not on file  Lifestyle  . Physical activity:    Days per week: Not on file    Minutes per session: Not on file  . Stress: Not on file  Relationships  . Social connections:    Talks on phone: Not on file    Gets together: Not on file    Attends religious service: Not on file    Active member of club or organization: Not on file    Attends meetings of clubs or organizations: Not on file    Relationship status: Not on file  . Intimate partner violence:    Fear of current or ex partner: Not on file    Emotionally abused: Not on file    Physically abused: Not on file    Forced sexual activity: Not on file  Other Topics Concern  . Not on file  Social History Narrative   Married.   2 children, 4 grandchildren.   Works as a Dealer.   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     Family History  Problem Relation Age of Onset  . Hypertension Father   . Stroke Paternal Grandmother   . Diabetes Brother        Type 1    Allergies  Allergen Reactions  .  Codeine Itching    No current outpatient medications on file prior to visit.   No current facility-administered medications on file prior to visit.     BP 126/80   Pulse (!) 58   Temp 97.8 F (36.6 C) (Oral)   Ht 5\' 10"  (1.778 m)   Wt 186 lb 8 oz (84.6 kg)   SpO2 97%   BMI 26.76 kg/m    Objective:   Physical Exam  Constitutional: He is oriented to person, place, and time. He appears well-nourished.  HENT:  Mouth/Throat: No oropharyngeal exudate.  Eyes: Pupils are equal, round, and reactive to light. EOM are normal.  Neck: Neck supple. No thyromegaly present.  Cardiovascular: Normal rate and regular rhythm.  Respiratory: Effort normal and breath sounds normal.  GI:  Soft. Bowel sounds are normal. There is no tenderness.  Musculoskeletal: Normal range of motion.  Neurological: He is alert and oriented to person, place, and time.  Skin: Skin is warm and dry.  Psychiatric: He has a normal mood and affect.           Assessment & Plan:

## 2018-05-21 NOTE — Patient Instructions (Signed)
Stop by the lab prior to leaving today. I will notify you of your results once received.   Continue exercising. You should be getting 150 minutes of moderate intensity exercise weekly.  Continue to work on a healthy diet. Ensure you are consuming 64 ounces of water daily.  We will see you in one year for your annual exam or sooner if needed.  It was a pleasure to see you today!   Preventive Care 40-64 Years, Male Preventive care refers to lifestyle choices and visits with your health care provider that can promote health and wellness. What does preventive care include?  A yearly physical exam. This is also called an annual well check.  Dental exams once or twice a year.  Routine eye exams. Ask your health care provider how often you should have your eyes checked.  Personal lifestyle choices, including: ? Daily care of your teeth and gums. ? Regular physical activity. ? Eating a healthy diet. ? Avoiding tobacco and drug use. ? Limiting alcohol use. ? Practicing safe sex. ? Taking low-dose aspirin every day starting at age 3. What happens during an annual well check? The services and screenings done by your health care provider during your annual well check will depend on your age, overall health, lifestyle risk factors, and family history of disease. Counseling Your health care provider may ask you questions about your:  Alcohol use.  Tobacco use.  Drug use.  Emotional well-being.  Home and relationship well-being.  Sexual activity.  Eating habits.  Work and work Statistician.  Screening You may have the following tests or measurements:  Height, weight, and BMI.  Blood pressure.  Lipid and cholesterol levels. These may be checked every 5 years, or more frequently if you are over 74 years old.  Skin check.  Lung cancer screening. You may have this screening every year starting at age 56 if you have a 30-pack-year history of smoking and currently smoke or  have quit within the past 15 years.  Fecal occult blood test (FOBT) of the stool. You may have this test every year starting at age 8.  Flexible sigmoidoscopy or colonoscopy. You may have a sigmoidoscopy every 5 years or a colonoscopy every 10 years starting at age 11.  Prostate cancer screening. Recommendations will vary depending on your family history and other risks.  Hepatitis C blood test.  Hepatitis B blood test.  Sexually transmitted disease (STD) testing.  Diabetes screening. This is done by checking your blood sugar (glucose) after you have not eaten for a while (fasting). You may have this done every 1-3 years.  Discuss your test results, treatment options, and if necessary, the need for more tests with your health care provider. Vaccines Your health care provider may recommend certain vaccines, such as:  Influenza vaccine. This is recommended every year.  Tetanus, diphtheria, and acellular pertussis (Tdap, Td) vaccine. You may need a Td booster every 10 years.  Varicella vaccine. You may need this if you have not been vaccinated.  Zoster vaccine. You may need this after age 42.  Measles, mumps, and rubella (MMR) vaccine. You may need at least one dose of MMR if you were born in 1957 or later. You may also need a second dose.  Pneumococcal 13-valent conjugate (PCV13) vaccine. You may need this if you have certain conditions and have not been vaccinated.  Pneumococcal polysaccharide (PPSV23) vaccine. You may need one or two doses if you smoke cigarettes or if you have certain conditions.  Meningococcal vaccine. You may need this if you have certain conditions.  Hepatitis A vaccine. You may need this if you have certain conditions or if you travel or work in places where you may be exposed to hepatitis A.  Hepatitis B vaccine. You may need this if you have certain conditions or if you travel or work in places where you may be exposed to hepatitis B.  Haemophilus  influenzae type b (Hib) vaccine. You may need this if you have certain risk factors.  Talk to your health care provider about which screenings and vaccines you need and how often you need them. This information is not intended to replace advice given to you by your health care provider. Make sure you discuss any questions you have with your health care provider. Document Released: 08/19/2015 Document Revised: 04/11/2016 Document Reviewed: 05/24/2015 Elsevier Interactive Patient Education  [employer].

## 2018-05-21 NOTE — Assessment & Plan Note (Signed)
Stable in the office today. BMP pending. Continue HCTZ.

## 2018-05-21 NOTE — Assessment & Plan Note (Signed)
Tetanus due, provided today. Influenza vaccination provided today. PSA UTD, due next year. Discussed to work on diet, get regular exercise. Exam unremarkable. Labs pending. Follow up in 1 year for CPE.

## 2018-05-21 NOTE — Assessment & Plan Note (Signed)
Repeat lipids pending.  Discussed the importance of a healthy diet and regular exercise in order for weight loss, and to reduce the risk of any potential medical problems.  

## 2018-05-22 LAB — HEPATITIS C ANTIBODY
Hepatitis C Ab: NONREACTIVE
SIGNAL TO CUT-OFF: 0.02 (ref <1.00)

## 2018-06-12 ENCOUNTER — Other Ambulatory Visit: Payer: Self-pay | Admitting: Primary Care

## 2018-06-12 DIAGNOSIS — I10 Essential (primary) hypertension: Secondary | ICD-10-CM

## 2018-08-21 ENCOUNTER — Other Ambulatory Visit: Payer: Self-pay | Admitting: Primary Care

## 2018-08-21 DIAGNOSIS — K219 Gastro-esophageal reflux disease without esophagitis: Secondary | ICD-10-CM

## 2018-08-21 MED ORDER — FAMOTIDINE 20 MG PO TABS: 20.0000 mg | ORAL_TABLET | Freq: Two times a day (BID) | ORAL | 0 refills | Status: DC

## 2019-05-22 ENCOUNTER — Encounter: Payer: BLUE CROSS/BLUE SHIELD | Admitting: Primary Care

## 2019-05-28 ENCOUNTER — Other Ambulatory Visit: Payer: Self-pay

## 2019-05-28 ENCOUNTER — Ambulatory Visit (INDEPENDENT_AMBULATORY_CARE_PROVIDER_SITE_OTHER): Payer: BC Managed Care – PPO | Admitting: Primary Care

## 2019-05-28 ENCOUNTER — Encounter: Payer: Self-pay | Admitting: Primary Care

## 2019-05-28 VITALS — BP 130/82 | HR 56 | Temp 97.6°F | Ht 70.0 in | Wt 174.5 lb

## 2019-05-28 DIAGNOSIS — R7303 Prediabetes: Secondary | ICD-10-CM

## 2019-05-28 DIAGNOSIS — E785 Hyperlipidemia, unspecified: Secondary | ICD-10-CM | POA: Diagnosis not present

## 2019-05-28 DIAGNOSIS — Z1211 Encounter for screening for malignant neoplasm of colon: Secondary | ICD-10-CM

## 2019-05-28 DIAGNOSIS — Z23 Encounter for immunization: Secondary | ICD-10-CM

## 2019-05-28 DIAGNOSIS — I10 Essential (primary) hypertension: Secondary | ICD-10-CM | POA: Diagnosis not present

## 2019-05-28 DIAGNOSIS — Z125 Encounter for screening for malignant neoplasm of prostate: Secondary | ICD-10-CM

## 2019-05-28 DIAGNOSIS — Z0000 Encounter for general adult medical examination without abnormal findings: Secondary | ICD-10-CM | POA: Diagnosis not present

## 2019-05-28 DIAGNOSIS — K219 Gastro-esophageal reflux disease without esophagitis: Secondary | ICD-10-CM

## 2019-05-28 NOTE — Progress Notes (Signed)
Subjective:    Patient ID: Daniel Giles, male    DOB: 1961-04-21, 58 y.o.   MRN: [id number]  HPI  Mr. Ridgel is a 58 year old male who presents today for complete physical.  Immunizations: -Tetanus: Completed in 2019 -Influenza: Due today -Shingles: Never completed  Diet: He endorses healthy diet. Mostly home cooked meals with vegetables, protein, starch. Desserts infrequently. Drinking water, unsweet tea. Exercise: He is active at work, plays baseball with his children.  Eye exam: Completed in 2019 Dental exam: Completes semi-annually  Colonoscopy: Completed in 2008 PSA: 0.26 in 2017, pending Hep C Screen: Negative in 2019  BP Readings from Last 3 Encounters:  05/28/19 130/82  05/21/18 126/80  03/21/17 118/80     Review of Systems  Constitutional: Negative for unexpected weight change.  HENT: Negative for rhinorrhea.   Respiratory: Negative for cough and shortness of breath.   Cardiovascular: Negative for chest pain.  Gastrointestinal: Negative for constipation and diarrhea.       Intermittent GERD symptoms, doing well on Pepcid  Genitourinary: Negative for difficulty urinating.  Musculoskeletal: Negative for arthralgias and myalgias.  Skin: Negative for rash.  Allergic/Immunologic: Negative for environmental allergies.  Neurological: Negative for dizziness, numbness and headaches.  Psychiatric/Behavioral: The patient is not nervous/anxious.        Past Medical History:  Diagnosis Date  . Essential hypertension   . GERD (gastroesophageal reflux disease)   . Seasonal allergies      Social History   Socioeconomic History  . Marital status: Married    Spouse name: Not on file  . Number of children: Not on file  . Years of education: Not on file  . Highest education level: Not on file  Occupational History  . Not on file  Social Needs  . Financial resource strain: Not on file  . Food insecurity    Worry: Not on file    Inability: Not on file  .  Transportation needs    Medical: Not on file    Non-medical: Not on file  Tobacco Use  . Smoking status: Never Smoker  . Smokeless tobacco: Never Used  Substance and Sexual Activity  . Alcohol use: No    Alcohol/week: 0.0 standard drinks  . Drug use: No  . Sexual activity: Not on file  Lifestyle  . Physical activity    Days per week: Not on file    Minutes per session: Not on file  . Stress: Not on file  Relationships  . Social Herbalist on phone: Not on file    Gets together: Not on file    Attends religious service: Not on file    Active member of club or organization: Not on file    Attends meetings of clubs or organizations: Not on file    Relationship status: Not on file  . Intimate partner violence    Fear of current or ex partner: Not on file    Emotionally abused: Not on file    Physically abused: Not on file    Forced sexual activity: Not on file  Other Topics Concern  . Not on file  Social History Narrative   Married.   2 children, 4 grandchildren.   Works as a Dealer.   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    Past Surgical History:  Procedure Laterality Date  . KNEE SURGERY Bilateral   . LIPOMA EXCISION     oral cavity  . ROTATOR CUFF REPAIR  Right     Family History  Problem Relation Age of Onset  . Hypertension Father   . Stroke Paternal Grandmother   . Diabetes Brother        Type 1    Allergies  Allergen Reactions  . Codeine Itching    Current Outpatient Medications on File Prior to Visit  Medication Sig Dispense Refill  . famotidine (PEPCID) 20 MG tablet Take 1 tablet (20 mg total) by mouth 2 (two) times daily. For heartburn. 180 tablet 0  . hydrochlorothiazide (HYDRODIURIL) 25 MG tablet Take 1 tablet by mouth once daily for blood pressure. (Patient not taking: Reported on 05/28/2019) 90 tablet 3   No current facility-administered medications on file prior to visit.     BP 130/82   Pulse (!) 56    Temp 97.6 F (36.4 C) (Temporal)   Ht 5\' 10"  (1.778 m)   Wt 174 lb 8 oz (79.2 kg)   SpO2 98%   BMI 25.04 kg/m    Objective:   Physical Exam  Constitutional: He is oriented to person, place, and time. He appears well-nourished.  HENT:  Right Ear: Tympanic membrane and ear canal normal.  Left Ear: Tympanic membrane and ear canal normal.  Mouth/Throat: Oropharynx is clear and moist.  Eyes: Pupils are equal, round, and reactive to light. EOM are normal.  Neck: Neck supple.  Cardiovascular: Normal rate and regular rhythm.  Respiratory: Effort normal and breath sounds normal.  GI: Soft. Bowel sounds are normal. There is no abdominal tenderness.  Musculoskeletal: Normal range of motion.  Neurological: He is alert and oriented to person, place, and time.  Skin: Skin is warm and dry.  Psychiatric: He has a normal mood and affect.           Assessment & Plan:

## 2019-05-28 NOTE — Addendum Note (Signed)
Addended by: Jacqualin Combes on: 05/28/2019 03:15 PM   Modules accepted: Orders

## 2019-05-28 NOTE — Assessment & Plan Note (Signed)
Tetanus UTD, first Shingrix and influenza vaccination provided today.  PSA pending. Colon cancer screening due, orders placed. Encouraged regular exercise, healthy diet. Exam today unremarkable. Labs pending.

## 2019-05-28 NOTE — Addendum Note (Signed)
Addended by: Cloyd Stagers on: 05/28/2019 11:38 AM   Modules accepted: Orders

## 2019-05-28 NOTE — Assessment & Plan Note (Signed)
Doing well on PRN famotidine, continue same.

## 2019-05-28 NOTE — Assessment & Plan Note (Signed)
Stable in the office today, has been off of HCTZ for months. Home BP readings of 120's/80's. Continue to monitor off meds.

## 2019-05-28 NOTE — Assessment & Plan Note (Signed)
Repeat lipids pending. Encouraged regular exercise, healthy diet.

## 2019-05-28 NOTE — Patient Instructions (Signed)
Stop by the lab prior to leaving today. I will notify you of your results once received.   You will be contacted regarding your referral to GI for the colonoscopy.  Please let us know if you have not been contacted within one week.   Continue exercising. You should be getting 150 minutes of moderate intensity exercise weekly.  Continue to work on a healthy diet. Ensure you are consuming 64 ounces of water daily.  It was a pleasure to see you today!   Preventive Care 68-36 Years Old, Male Preventive care refers to lifestyle choices and visits with your health care provider that can promote health and wellness. This includes:  A yearly physical exam. This is also called an annual well check.  Regular dental and eye exams.  Immunizations.  Screening for certain conditions.  Healthy lifestyle choices, such as eating a healthy diet, getting regular exercise, not using drugs or products that contain nicotine and tobacco, and limiting alcohol use. What can I expect for my preventive care visit? Physical exam Your health care provider will check:  Height and weight. These may be used to calculate body mass index (BMI), which is a measurement that tells if you are at a healthy weight.  Heart rate and blood pressure.  Your skin for abnormal spots. Counseling Your health care provider may ask you questions about:  Alcohol, tobacco, and drug use.  Emotional well-being.  Home and relationship well-being.  Sexual activity.  Eating habits.  Work and work Statistician. What immunizations do I need?  Influenza (flu) vaccine  This is recommended every year. Tetanus, diphtheria, and pertussis (Tdap) vaccine  You may need a Td booster every 10 years. Varicella (chickenpox) vaccine  You may need this vaccine if you have not already been vaccinated. Zoster (shingles) vaccine  You may need this after age 56. Measles, mumps, and rubella (MMR) vaccine  You may need at least one  dose of MMR if you were born in 1957 or later. You may also need a second dose. Pneumococcal conjugate (PCV13) vaccine  You may need this if you have certain conditions and were not previously vaccinated. Pneumococcal polysaccharide (PPSV23) vaccine  You may need one or two doses if you smoke cigarettes or if you have certain conditions. Meningococcal conjugate (MenACWY) vaccine  You may need this if you have certain conditions. Hepatitis A vaccine  You may need this if you have certain conditions or if you travel or work in places where you may be exposed to hepatitis A. Hepatitis B vaccine  You may need this if you have certain conditions or if you travel or work in places where you may be exposed to hepatitis B. Haemophilus influenzae type b (Hib) vaccine  You may need this if you have certain risk factors. Human papillomavirus (HPV) vaccine  If recommended by your health care provider, you may need three doses over 6 months. You may receive vaccines as individual doses or as more than one vaccine together in one shot (combination vaccines). Talk with your health care provider about the risks and benefits of combination vaccines. What tests do I need? Blood tests  Lipid and cholesterol levels. These may be checked every 5 years, or more frequently if you are over 65 years old.  Hepatitis C test.  Hepatitis B test. Screening  Lung cancer screening. You may have this screening every year starting at age 31 if you have a 30-pack-year history of smoking and currently smoke or have quit within  the past 15 years.  Prostate cancer screening. Recommendations will vary depending on your family history and other risks.  Colorectal cancer screening. All adults should have this screening starting at age 57 and continuing until age 76. Your health care provider may recommend screening at age 18 if you are at increased risk. You will have tests every 1-10 years, depending on your results  and the type of screening test.  Diabetes screening. This is done by checking your blood sugar (glucose) after you have not eaten for a while (fasting). You may have this done every 1-3 years.  Sexually transmitted disease (STD) testing. Follow these instructions at home: Eating and drinking  Eat a diet that includes fresh fruits and vegetables, whole grains, lean protein, and low-fat dairy products.  Take vitamin and mineral supplements as recommended by your health care provider.  Do not drink alcohol if your health care provider tells you not to drink.  If you drink alcohol: ? Limit how much you have to 0-2 drinks a day. ? Be aware of how much alcohol is in your drink. In the [place] one drink equals one 12 oz bottle of beer (355 mL), one 5 oz glass of wine (148 mL), or one 1 oz glass of hard liquor (44 mL). Lifestyle  Take daily care of your teeth and gums.  Stay active. Exercise for at least 30 minutes on 5 or more days each week.  Do not use any products that contain nicotine or tobacco, such as cigarettes, e-cigarettes, and chewing tobacco. If you need help quitting, ask your health care provider.  If you are sexually active, practice safe sex. Use a condom or other form of protection to prevent STIs (sexually transmitted infections).  Talk with your health care provider about taking a low-dose aspirin every day starting at age 61. What's next?  Go to your health care provider once a year for a well check visit.  Ask your health care provider how often you should have your eyes and teeth checked.  Stay up to date on all vaccines. This information is not intended to replace advice given to you by your health care provider. Make sure you discuss any questions you have with your health care provider. Document Released: 08/19/2015 Document Revised: 07/17/2018 Document Reviewed: 07/17/2018 Elsevier Patient Education  2020 [employer].

## 2019-05-29 ENCOUNTER — Other Ambulatory Visit (INDEPENDENT_AMBULATORY_CARE_PROVIDER_SITE_OTHER): Payer: BC Managed Care – PPO

## 2019-05-29 DIAGNOSIS — Z0000 Encounter for general adult medical examination without abnormal findings: Secondary | ICD-10-CM | POA: Diagnosis not present

## 2019-05-29 DIAGNOSIS — R7303 Prediabetes: Secondary | ICD-10-CM

## 2019-05-29 DIAGNOSIS — E785 Hyperlipidemia, unspecified: Secondary | ICD-10-CM

## 2019-05-29 DIAGNOSIS — Z125 Encounter for screening for malignant neoplasm of prostate: Secondary | ICD-10-CM

## 2019-05-29 NOTE — Addendum Note (Signed)
Addended by: Cloyd Stagers on: 05/29/2019 04:40 PM   Modules accepted: Orders

## 2019-05-30 LAB — COMPREHENSIVE METABOLIC PANEL
AG Ratio: 2 (calc) (ref 1.0–2.5)
ALT: 14 U/L (ref 9–46)
AST: 15 U/L (ref 10–35)
Albumin: 4.3 g/dL (ref 3.6–5.1)
Alkaline phosphatase (APISO): 54 U/L (ref 35–144)
BUN/Creatinine Ratio: 13 (calc) (ref 6–22)
BUN: 21 mg/dL (ref 7–25)
CO2: 27 mmol/L (ref 20–32)
Calcium: 9.2 mg/dL (ref 8.6–10.3)
Chloride: 105 mmol/L (ref 98–110)
Creat: 1.63 mg/dL — ABNORMAL HIGH (ref 0.70–1.33)
Globulin: 2.1 g/dL (calc) (ref 1.9–3.7)
Glucose, Bld: 96 mg/dL (ref 65–99)
Potassium: 4.3 mmol/L (ref 3.5–5.3)
Sodium: 140 mmol/L (ref 135–146)
Total Bilirubin: 0.9 mg/dL (ref 0.2–1.2)
Total Protein: 6.4 g/dL (ref 6.1–8.1)

## 2019-05-30 LAB — CBC
HCT: 38.3 % — ABNORMAL LOW (ref 38.5–50.0)
Hemoglobin: 13.4 g/dL (ref 13.2–17.1)
MCH: 31 pg (ref 27.0–33.0)
MCHC: 35 g/dL (ref 32.0–36.0)
MCV: 88.7 fL (ref 80.0–100.0)
MPV: 12.3 fL (ref 7.5–12.5)
Platelets: 157 10*3/uL (ref 140–400)
RBC: 4.32 10*6/uL (ref 4.20–5.80)
RDW: 12 % (ref 11.0–15.0)
WBC: 8.4 10*3/uL (ref 3.8–10.8)

## 2019-05-30 LAB — LIPID PANEL
Cholesterol: 166 mg/dL (ref <200)
HDL: 41 mg/dL (ref >=40)
LDL Cholesterol (Calc): 105 mg/dL (calc) — ABNORMAL HIGH
Non-HDL Cholesterol (Calc): 125 mg/dL (calc) (ref <130)
Total CHOL/HDL Ratio: 4 (calc) (ref <5.0)
Triglycerides: 108 mg/dL (ref <150)

## 2019-05-30 LAB — HEMOGLOBIN A1C
Hgb A1c MFr Bld: 5.4 % of total Hgb (ref <5.7)
Mean Plasma Glucose: 108 (calc)
eAG (mmol/L): 6 (calc)

## 2019-05-30 LAB — PSA: PSA: 0.2 ng/mL (ref <=4.0)

## 2019-06-01 ENCOUNTER — Other Ambulatory Visit: Payer: Self-pay | Admitting: Primary Care

## 2019-06-01 DIAGNOSIS — N289 Disorder of kidney and ureter, unspecified: Secondary | ICD-10-CM

## 2019-06-09 ENCOUNTER — Encounter: Payer: Self-pay | Admitting: Internal Medicine

## 2019-06-10 ENCOUNTER — Other Ambulatory Visit: Payer: Self-pay

## 2019-06-10 ENCOUNTER — Other Ambulatory Visit (INDEPENDENT_AMBULATORY_CARE_PROVIDER_SITE_OTHER): Payer: BC Managed Care – PPO

## 2019-06-10 DIAGNOSIS — N289 Disorder of kidney and ureter, unspecified: Secondary | ICD-10-CM | POA: Diagnosis not present

## 2019-06-10 LAB — BASIC METABOLIC PANEL
BUN: 22 mg/dL (ref 6–23)
CO2: 29 mEq/L (ref 19–32)
Calcium: 9.1 mg/dL (ref 8.4–10.5)
Chloride: 104 mEq/L (ref 96–112)
Creatinine, Ser: 1.45 mg/dL (ref 0.40–1.50)
GFR: 49.84 mL/min — ABNORMAL LOW (ref >60.00)
Glucose, Bld: 99 mg/dL (ref 70–99)
Potassium: 4.2 mEq/L (ref 3.5–5.1)
Sodium: 140 mEq/L (ref 135–145)

## 2019-06-15 ENCOUNTER — Ambulatory Visit (AMBULATORY_SURGERY_CENTER): Payer: BC Managed Care – PPO | Admitting: *Deleted

## 2019-06-15 ENCOUNTER — Other Ambulatory Visit: Payer: Self-pay

## 2019-06-15 ENCOUNTER — Other Ambulatory Visit: Payer: Self-pay | Admitting: Primary Care

## 2019-06-15 VITALS — Temp 97.6°F | Ht 70.0 in | Wt 177.0 lb

## 2019-06-15 DIAGNOSIS — Z1211 Encounter for screening for malignant neoplasm of colon: Secondary | ICD-10-CM

## 2019-06-15 DIAGNOSIS — Z1159 Encounter for screening for other viral diseases: Secondary | ICD-10-CM

## 2019-06-15 DIAGNOSIS — N289 Disorder of kidney and ureter, unspecified: Secondary | ICD-10-CM

## 2019-06-15 NOTE — Progress Notes (Signed)
Hard time waking up from knee surgery per pt.  Patient is here in-person for PV. Patient denies any allergies to eggs or soy. Patient denies any problems with anesthesia/sedation. Patient denies any oxygen use at home. Patient denies taking any diet/weight loss medications or blood thinners. Patient is not being treated for MRSA or C-diff. EMMI education assisgned to the patient for the procedure, this was explained and instructions given to patient. COVID-19 screening test is on 11/25 830 am, the pt is aware. Pt is aware that care partner will wait in the car during procedure; if they feel like they will be too hot or cold to wait in the car; they may wait in the 4 th floor lobby. Patient is aware to bring only one care partner. We want them to wear a mask (we do not have any that we can provide them), practice social distancing, and we will check their temperatures when they get here.  I did remind the patient that their care partner needs to stay in the parking lot the entire time and have a cell phone available, we will call them when the pt is ready for discharge. Patient will wear mask into building.

## 2019-06-18 ENCOUNTER — Ambulatory Visit
Admission: RE | Admit: 2019-06-18 | Discharge: 2019-06-18 | Disposition: A | Discharge status: Home / Self Care | Payer: BC Managed Care – PPO | Source: Ambulatory Visit | Attending: Primary Care | Admitting: Primary Care

## 2019-06-18 DIAGNOSIS — N289 Disorder of kidney and ureter, unspecified: Secondary | ICD-10-CM

## 2019-06-22 ENCOUNTER — Telehealth: Payer: Self-pay | Admitting: Primary Care

## 2019-06-22 NOTE — Telephone Encounter (Signed)
Patient is returning your call.  

## 2019-06-22 NOTE — Telephone Encounter (Signed)
Pt returned chan's call

## 2019-06-22 NOTE — Telephone Encounter (Signed)
Left message on voicemail for patient to call back. 

## 2019-06-23 NOTE — Telephone Encounter (Signed)
Spoken and notified patient of Kate Clark's comments. Patient verbalized understanding.  

## 2019-06-24 ENCOUNTER — Encounter: Payer: Self-pay | Admitting: Internal Medicine

## 2019-06-29 ENCOUNTER — Encounter: Payer: BC Managed Care – PPO | Admitting: Internal Medicine

## 2019-07-06 ENCOUNTER — Other Ambulatory Visit: Payer: Self-pay | Admitting: Internal Medicine

## 2019-07-07 LAB — SARS CORONAVIRUS 2 (TAT 6-24 HRS): SARS Coronavirus 2: NEGATIVE

## 2019-07-08 ENCOUNTER — Encounter: Payer: BC Managed Care – PPO | Admitting: Internal Medicine

## 2019-07-09 ENCOUNTER — Ambulatory Visit (AMBULATORY_SURGERY_CENTER): Payer: BC Managed Care – PPO | Admitting: Internal Medicine

## 2019-07-09 ENCOUNTER — Encounter: Payer: Self-pay | Admitting: Internal Medicine

## 2019-07-09 ENCOUNTER — Other Ambulatory Visit: Payer: Self-pay

## 2019-07-09 VITALS — BP 118/75 | HR 52 | Temp 98.0°F | Resp 10 | Ht 70.0 in | Wt 177.0 lb

## 2019-07-09 DIAGNOSIS — Z1211 Encounter for screening for malignant neoplasm of colon: Secondary | ICD-10-CM | POA: Diagnosis present

## 2019-07-09 DIAGNOSIS — D123 Benign neoplasm of transverse colon: Secondary | ICD-10-CM

## 2019-07-09 DIAGNOSIS — D120 Benign neoplasm of cecum: Secondary | ICD-10-CM | POA: Diagnosis not present

## 2019-07-09 MED ORDER — SODIUM CHLORIDE 0.9 % IV SOLN: 500.0000 mL | Freq: Once | INTRAVENOUS | Status: DC

## 2019-07-09 NOTE — Patient Instructions (Addendum)
I found and removed 3 tiny polyps that look benign but are probably pre-cancerous.  I will let you know pathology results and when to have another routine colonoscopy by mail and/or My Chart.  I appreciate the opportunity to care for you. Gatha Mayer, MD, [facility]  Polyp handout given to patient.  Resume previous diet. Continue present medications.  YOU HAD AN ENDOSCOPIC PROCEDURE TODAY AT [facility]:   Refer to the procedure report that was given to you for any specific questions about what was found during the examination.  If the procedure report does not answer your questions, please call your gastroenterologist to clarify.  If you requested that your care partner not be given the details of your procedure findings, then the procedure report has been included in a sealed envelope for you to review at your convenience later.  YOU SHOULD EXPECT: Some feelings of bloating in the abdomen. Passage of more gas than usual.  Walking can help get rid of the air that was put into your GI tract during the procedure and reduce the bloating. If you had a lower endoscopy (such as a colonoscopy or flexible sigmoidoscopy) you may notice spotting of blood in your stool or on the toilet paper. If you underwent a bowel prep for your procedure, you may not have a normal bowel movement for a few days.  Please Note:  You might notice some irritation and congestion in your nose or some drainage.  This is from the oxygen used during your procedure.  There is no need for concern and it should clear up in a day or so.  SYMPTOMS TO REPORT IMMEDIATELY:   Following lower endoscopy (colonoscopy or flexible sigmoidoscopy):  Excessive amounts of blood in the stool  Significant tenderness or worsening of abdominal pains  Swelling of the abdomen that is new, acute  Fever of 100F or higher For urgent or emergent issues, a gastroenterologist can be reached at any hour by calling [phone]  [phone].   DIET:  We do recommend a small meal at first, but then you may proceed to your regular diet.  Drink plenty of fluids but you should avoid alcoholic beverages for 24 hours.  ACTIVITY:  You should plan to take it easy for the rest of today and you should NOT DRIVE or use heavy machinery until tomorrow (because of the sedation medicines used during the test).    FOLLOW UP: Our staff will call the number listed on your records 48-72 hours following your procedure to check on you and address any questions or concerns that you may have regarding the information given to you following your procedure. If we do not reach you, we will leave a message.  We will attempt to reach you two times.  During this call, we will ask if you have developed any symptoms of COVID 19. If you develop any symptoms (ie: fever, flu-like symptoms, shortness of breath, cough etc.) before then, please call [phone].  If you test positive for Covid 19 in the 2 weeks post procedure, please call and report this information to [place].    If any biopsies were taken you will be contacted by phone or by letter within the next 1-3 weeks.  Please call us at [phone] if you have not heard about the biopsies in 3 weeks.    SIGNATURES/CONFIDENTIALITY: You and/or your care partner have signed paperwork which will be entered into your electronic medical record.  These signatures attest to the  fact that that the information above on your After Visit Summary has been reviewed and is understood.  Full responsibility of the confidentiality of this discharge information lies with you and/or your care-partner.

## 2019-07-09 NOTE — Op Note (Signed)
[place] Patient Name: Daniel Giles Procedure Date: 07/09/2019 1:55 PM MRN: [id number] Endoscopist: Gatha Mayer , MD Age: 58 Referring MD:  Date of Birth: 10/05/1960 Gender: Male Account #: [id number] Procedure:                Colonoscopy Indications:              Screening for colorectal malignant neoplasm, Last                            colonoscopy: 2008 Medicines:                Propofol per Anesthesia, Monitored Anesthesia Care Procedure:                Pre-Anesthesia Assessment:                           - Prior to the procedure, a History and Physical                            was performed, and patient medications and                            allergies were reviewed. The patient's tolerance of                            previous anesthesia was also reviewed. The risks                            and benefits of the procedure and the sedation                            options and risks were discussed with the patient.                            All questions were answered, and informed consent                            was obtained. Prior Anticoagulants: The patient has                            taken no previous anticoagulant or antiplatelet                            agents. ASA Grade Assessment: II - A patient with                            mild systemic disease. After reviewing the risks                            and benefits, the patient was deemed in                            satisfactory condition to undergo the procedure.  After obtaining informed consent, the colonoscope                            was passed under direct vision. Throughout the                            procedure, the patient's blood pressure, pulse, and                            oxygen saturations were monitored continuously. The                            Colonoscope was introduced through the anus and                            advanced to the the  cecum, identified by                            appendiceal orifice and ileocecal valve. The                            colonoscopy was performed without difficulty. The                            patient tolerated the procedure well. The quality                            of the bowel preparation was excellent. The                            ileocecal valve, appendiceal orifice, and rectum                            were photographed. The bowel preparation used was                            Miralax via split dose instruction. Scope In: 2:15:07 PM Scope Out: 2:30:44 PM Scope Withdrawal Time: 0 hours 11 minutes 22 seconds  Total Procedure Duration: 0 hours 15 minutes 37 seconds  Findings:                 The perianal and digital rectal examinations were                            normal. Pertinent negatives include normal prostate                            (size, shape, and consistency).                           Three sessile polyps were found in the transverse                            colon and cecum. The polyps were diminutive in  size. These polyps were removed with a cold snare.                            Resection and retrieval were complete. Verification                            of patient identification for the specimen was                            done. Estimated blood loss was minimal.                           The exam was otherwise without abnormality on                            direct and retroflexion views. Complications:            No immediate complications. Estimated Blood Loss:     Estimated blood loss was minimal. Impression:               - Three diminutive polyps in the transverse colon                            and in the cecum, removed with a cold snare.                            Resected and retrieved.                           - The examination was otherwise normal on direct                            and retroflexion  views. Recommendation:           - Patient has a contact number available for                            emergencies. The signs and symptoms of potential                            delayed complications were discussed with the                            patient. Return to normal activities tomorrow.                            Written discharge instructions were provided to the                            patient.                           - Resume previous diet.                           - Continue present medications.                           -  Repeat colonoscopy is recommended. The                            colonoscopy date will be determined after pathology                            results from today's exam become available for                            review. Gatha Mayer, MD 07/09/2019 2:38:57 PM This report has been signed electronically.

## 2019-07-09 NOTE — Progress Notes (Signed)
Pt's states no medical or surgical changes since previsit or office visit.  Temp JB VS CW 

## 2019-07-09 NOTE — Progress Notes (Signed)
Called to room to assist during endoscopic procedure.  Patient ID and intended procedure confirmed with present staff. Received instructions for my participation in the procedure from the performing physician.  

## 2019-07-09 NOTE — Progress Notes (Signed)
Report to PACU, RN, vss, BBS= Clear.  

## 2019-07-13 ENCOUNTER — Telehealth: Payer: Self-pay

## 2019-07-13 NOTE — Telephone Encounter (Signed)
  Follow up Call-  Call back number 07/09/2019  Post procedure Call Back phone  # [phone]  Permission to leave phone message Yes  Some recent data might be hidden     Patient questions:  Do you have a fever, pain , or abdominal swelling? No. Pain Score  0 *  Have you tolerated food without any problems? Yes.    Have you been able to return to your normal activities? Yes.    Do you have any questions about your discharge instructions: Diet   No. Medications  No. Follow up visit  No.  Do you have questions or concerns about your Care? No.  Actions: * If pain score is 4 or above: 1. No action needed, pain <4.Have you developed a fever since your procedure? no  2.   Have you had an respiratory symptoms (SOB or cough) since your procedure? no  3.   Have you tested positive for COVID 19 since your procedure no  4.   Have you had any family members/close contacts diagnosed with the COVID 19 since your procedure?  no   If yes to any of these questions please route to Joylene John, RN and Alphonsa Gin, Therapist, sports.

## 2019-07-23 ENCOUNTER — Encounter: Payer: Self-pay | Admitting: Internal Medicine

## 2019-07-23 DIAGNOSIS — Z860101 Personal history of adenomatous and serrated colon polyps: Secondary | ICD-10-CM

## 2019-07-23 DIAGNOSIS — Z86010 Personal history of colonic polyps: Secondary | ICD-10-CM

## 2019-07-23 HISTORY — DX: Personal history of colonic polyps: Z86.010

## 2019-07-23 HISTORY — DX: Personal history of adenomatous and serrated colon polyps: Z86.0101

## 2019-08-05 ENCOUNTER — Ambulatory Visit (INDEPENDENT_AMBULATORY_CARE_PROVIDER_SITE_OTHER): Payer: BC Managed Care – PPO | Admitting: *Deleted

## 2019-08-05 ENCOUNTER — Other Ambulatory Visit: Payer: Self-pay

## 2019-08-05 DIAGNOSIS — Z23 Encounter for immunization: Secondary | ICD-10-CM

## 2019-09-06 ENCOUNTER — Other Ambulatory Visit: Payer: Self-pay | Admitting: Primary Care

## 2019-09-06 DIAGNOSIS — N289 Disorder of kidney and ureter, unspecified: Secondary | ICD-10-CM

## 2019-09-07 ENCOUNTER — Telehealth: Payer: Self-pay

## 2019-09-07 NOTE — Telephone Encounter (Signed)
LVM w COVID screen and back lab info 2.1.2021 TLJ

## 2019-09-10 ENCOUNTER — Other Ambulatory Visit (INDEPENDENT_AMBULATORY_CARE_PROVIDER_SITE_OTHER): Payer: BC Managed Care – PPO

## 2019-09-10 ENCOUNTER — Other Ambulatory Visit: Payer: Self-pay

## 2019-09-10 DIAGNOSIS — N289 Disorder of kidney and ureter, unspecified: Secondary | ICD-10-CM

## 2019-09-10 LAB — BASIC METABOLIC PANEL
BUN: 20 mg/dL (ref 6–23)
CO2: 29 mEq/L (ref 19–32)
Calcium: 9.3 mg/dL (ref 8.4–10.5)
Chloride: 106 mEq/L (ref 96–112)
Creatinine, Ser: 1.2 mg/dL (ref 0.40–1.50)
GFR: 61.95 mL/min (ref >60.00)
Glucose, Bld: 103 mg/dL — ABNORMAL HIGH (ref 70–99)
Potassium: 4.4 mEq/L (ref 3.5–5.1)
Sodium: 140 mEq/L (ref 135–145)

## 2019-09-22 ENCOUNTER — Other Ambulatory Visit: Payer: Self-pay | Admitting: Primary Care

## 2019-09-22 DIAGNOSIS — K219 Gastro-esophageal reflux disease without esophagitis: Secondary | ICD-10-CM

## 2020-02-12 ENCOUNTER — Other Ambulatory Visit: Payer: Self-pay | Admitting: Primary Care

## 2020-02-12 DIAGNOSIS — K219 Gastro-esophageal reflux disease without esophagitis: Secondary | ICD-10-CM

## 2020-05-05 ENCOUNTER — Telehealth: Payer: Self-pay

## 2020-05-05 ENCOUNTER — Other Ambulatory Visit: Payer: Self-pay | Admitting: Primary Care

## 2020-05-05 DIAGNOSIS — U071 COVID-19: Secondary | ICD-10-CM

## 2020-05-05 NOTE — Progress Notes (Signed)
I connected by phone with Daniel Giles on 05/05/2020 at 3:21 PM to discuss the potential use of a new treatment for mild to moderate COVID-19 viral infection in non-hospitalized patients.  This patient is a 59 y.o. male that meets the FDA criteria for Emergency Use Authorization of COVID monoclonal antibody casirivimab/imdevimab or bamlanivimab/eteseviamb.  Has a (+) direct SARS-CoV-2 viral test result  Has mild or moderate COVID-19   Is NOT hospitalized due to COVID-19  Is within 10 days of symptom onset  Has at least one of the high risk factor(s) for progression to severe COVID-19 and/or hospitalization as defined in EUA.  Specific high risk criteria : Cardiovascular disease or hypertension   I have spoken and communicated the following to the patient or parent/caregiver regarding COVID monoclonal antibody treatment:  1. FDA has authorized the emergency use for the treatment of mild to moderate COVID-19 in adults and pediatric patients with positive results of direct SARS-CoV-2 viral testing who are 45 years of age and older weighing at least 40 kg, and who are at high risk for progressing to severe COVID-19 and/or hospitalization.  2. The significant known and potential risks and benefits of COVID monoclonal antibody, and the extent to which such potential risks and benefits are unknown.  3. Information on available alternative treatments and the risks and benefits of those alternatives, including clinical trials.  4. Patients treated with COVID monoclonal antibody should continue to self-isolate and use infection control measures (e.g., wear mask, isolate, social distance, avoid sharing personal items, clean and disinfect "high touch" surfaces, and frequent handwashing) according to CDC guidelines.   5. The patient or parent/caregiver has the option to accept or refuse COVID monoclonal antibody treatment.  After reviewing this information with the patient, the patient has agreed  to receive one of the available covid 19 monoclonal antibodies and will be provided an appropriate fact sheet prior to infusion. Pleas Koch, NP 05/05/2020 3:21 PM

## 2020-05-05 NOTE — Telephone Encounter (Signed)
Spoke with patient, symptom onset date of 05/03/2020.  He tested positive yesterday with a rapid home kit.  He qualifies for monoclonal antibody treatment given his history of hypertension.  He will bring a copy of his test results.  Patient was scheduled for 8:30 AM on 05/06/2020, instructions provided.  Orders placed.

## 2020-05-05 NOTE — Telephone Encounter (Signed)
Added from message from wife:   My husband((Daniel Giles also a patient)  has tested positive for Covid yesterday  - we both had the vaccine in April- is he a candidate for any of the antibody treatments?

## 2020-05-06 ENCOUNTER — Ambulatory Visit (HOSPITAL_COMMUNITY)
Admission: RE | Admit: 2020-05-06 | Discharge: 2020-05-06 | Disposition: A | Discharge status: Home / Self Care | Payer: BC Managed Care – PPO | Source: Ambulatory Visit | Attending: Pulmonary Disease | Admitting: Pulmonary Disease

## 2020-05-06 DIAGNOSIS — U071 COVID-19: Secondary | ICD-10-CM | POA: Diagnosis present

## 2020-05-06 MED ORDER — METHYLPREDNISOLONE SODIUM SUCC 125 MG IJ SOLR: 125.0000 mg | Freq: Once | INTRAMUSCULAR | Status: DC | PRN

## 2020-05-06 MED ORDER — ALBUTEROL SULFATE HFA 108 (90 BASE) MCG/ACT IN AERS: 2.0000 | INHALATION_SPRAY | Freq: Once | RESPIRATORY_TRACT | Status: DC | PRN

## 2020-05-06 MED ORDER — SODIUM CHLORIDE 0.9 % IV SOLN: INTRAVENOUS | Status: DC | PRN

## 2020-05-06 MED ORDER — EPINEPHRINE 0.3 MG/0.3ML IJ SOAJ: 0.3000 mg | Freq: Once | INTRAMUSCULAR | Status: DC | PRN

## 2020-05-06 MED ORDER — DIPHENHYDRAMINE HCL 50 MG/ML IJ SOLN: 50.0000 mg | Freq: Once | INTRAMUSCULAR | Status: DC | PRN

## 2020-05-06 MED ORDER — FAMOTIDINE IN NACL 20-0.9 MG/50ML-% IV SOLN: 20.0000 mg | Freq: Once | INTRAVENOUS | Status: DC | PRN

## 2020-05-06 MED ORDER — SODIUM CHLORIDE 0.9 % IV SOLN
1200.0000 mg | Freq: Once | INTRAVENOUS | Status: AC
  Administered 2020-05-06: 1200 mg via INTRAVENOUS

## 2020-05-06 NOTE — Discharge Instructions (Signed)

## 2020-05-06 NOTE — Progress Notes (Signed)
  Diagnosis: COVID-19  Physician: Dr. Wright  Procedure: [facility] Med: casirivimab\imdevimab infusion - Provided patient with casirivimab\imdevimab fact sheet for patients, parents and caregivers prior to infusion.  Complications: No immediate complications noted.  Discharge: Discharged home   Daniel Giles 05/06/2020  

## 2020-05-30 ENCOUNTER — Encounter: Payer: BC Managed Care – PPO | Admitting: Primary Care

## 2020-07-13 ENCOUNTER — Other Ambulatory Visit: Payer: Self-pay

## 2020-07-13 ENCOUNTER — Ambulatory Visit (INDEPENDENT_AMBULATORY_CARE_PROVIDER_SITE_OTHER): Payer: BC Managed Care – PPO | Admitting: Primary Care

## 2020-07-13 ENCOUNTER — Ambulatory Visit (INDEPENDENT_AMBULATORY_CARE_PROVIDER_SITE_OTHER)
Admission: RE | Admit: 2020-07-13 | Discharge: 2020-07-13 | Disposition: A | Discharge status: Home / Self Care | Payer: BC Managed Care – PPO | Source: Ambulatory Visit | Attending: Primary Care | Admitting: Primary Care

## 2020-07-13 ENCOUNTER — Encounter: Payer: Self-pay | Admitting: Primary Care

## 2020-07-13 VITALS — BP 123/74 | HR 86 | Temp 98.1°F | Ht 70.0 in | Wt 184.0 lb

## 2020-07-13 DIAGNOSIS — R7303 Prediabetes: Secondary | ICD-10-CM

## 2020-07-13 DIAGNOSIS — E785 Hyperlipidemia, unspecified: Secondary | ICD-10-CM | POA: Diagnosis not present

## 2020-07-13 DIAGNOSIS — I10 Essential (primary) hypertension: Secondary | ICD-10-CM | POA: Diagnosis not present

## 2020-07-13 DIAGNOSIS — Z114 Encounter for screening for human immunodeficiency virus [HIV]: Secondary | ICD-10-CM

## 2020-07-13 DIAGNOSIS — K219 Gastro-esophageal reflux disease without esophagitis: Secondary | ICD-10-CM

## 2020-07-13 DIAGNOSIS — Z0001 Encounter for general adult medical examination with abnormal findings: Secondary | ICD-10-CM | POA: Diagnosis not present

## 2020-07-13 DIAGNOSIS — R3589 Other polyuria: Secondary | ICD-10-CM | POA: Diagnosis not present

## 2020-07-13 DIAGNOSIS — M542 Cervicalgia: Secondary | ICD-10-CM | POA: Insufficient documentation

## 2020-07-13 DIAGNOSIS — Z23 Encounter for immunization: Secondary | ICD-10-CM | POA: Diagnosis not present

## 2020-07-13 DIAGNOSIS — Z86010 Personal history of colonic polyps: Secondary | ICD-10-CM

## 2020-07-13 DIAGNOSIS — R351 Nocturia: Secondary | ICD-10-CM | POA: Insufficient documentation

## 2020-07-13 DIAGNOSIS — D171 Benign lipomatous neoplasm of skin and subcutaneous tissue of trunk: Secondary | ICD-10-CM

## 2020-07-13 DIAGNOSIS — D179 Benign lipomatous neoplasm, unspecified: Secondary | ICD-10-CM | POA: Insufficient documentation

## 2020-07-13 HISTORY — DX: Cervicalgia: M54.2

## 2020-07-13 HISTORY — DX: Nocturia: R35.1

## 2020-07-13 LAB — PSA: PSA: 2.23 ng/mL (ref 0.10–4.00)

## 2020-07-13 LAB — COMPREHENSIVE METABOLIC PANEL
ALT: 22 U/L (ref 0–53)
AST: 20 U/L (ref 0–37)
Albumin: 4.6 g/dL (ref 3.5–5.2)
Alkaline Phosphatase: 53 U/L (ref 39–117)
BUN: 17 mg/dL (ref 6–23)
CO2: 31 mEq/L (ref 19–32)
Calcium: 9.4 mg/dL (ref 8.4–10.5)
Chloride: 104 mEq/L (ref 96–112)
Creatinine, Ser: 1.18 mg/dL (ref 0.40–1.50)
GFR: 67.35 mL/min (ref >60.00)
Glucose, Bld: 101 mg/dL — ABNORMAL HIGH (ref 70–99)
Potassium: 4.7 mEq/L (ref 3.5–5.1)
Sodium: 141 mEq/L (ref 135–145)
Total Bilirubin: 0.7 mg/dL (ref 0.2–1.2)
Total Protein: 7 g/dL (ref 6.0–8.3)

## 2020-07-13 LAB — POC URINALSYSI DIPSTICK (AUTOMATED)
Bilirubin, UA: NEGATIVE
Blood, UA: NEGATIVE
Glucose, UA: NEGATIVE
Ketones, UA: NEGATIVE
Nitrite, UA: NEGATIVE
Protein, UA: NEGATIVE
Spec Grav, UA: 1.02 (ref 1.010–1.025)
Urobilinogen, UA: 0.2 E.U./dL
pH, UA: 6 (ref 5.0–8.0)

## 2020-07-13 LAB — CBC
HCT: 44.1 % (ref 39.0–52.0)
Hemoglobin: 15 g/dL (ref 13.0–17.0)
MCHC: 34.1 g/dL (ref 30.0–36.0)
MCV: 89.6 fl (ref 78.0–100.0)
Platelets: 174 10*3/uL (ref 150.0–400.0)
RBC: 4.92 Mil/uL (ref 4.22–5.81)
RDW: 13.3 % (ref 11.5–15.5)
WBC: 5.9 10*3/uL (ref 4.0–10.5)

## 2020-07-13 LAB — LIPID PANEL
Cholesterol: 199 mg/dL (ref 0–200)
HDL: 44.3 mg/dL (ref >39.00)
LDL Cholesterol: 135 mg/dL — ABNORMAL HIGH (ref 0–99)
NonHDL: 154.36
Total CHOL/HDL Ratio: 4
Triglycerides: 98 mg/dL (ref 0.0–149.0)
VLDL: 19.6 mg/dL (ref 0.0–40.0)

## 2020-07-13 LAB — TSH: TSH: 1.52 u[IU]/mL (ref 0.35–4.50)

## 2020-07-13 LAB — HEMOGLOBIN A1C: Hgb A1c MFr Bld: 5.7 % (ref 4.6–6.5)

## 2020-07-13 MED ORDER — FAMOTIDINE 20 MG PO TABS: ORAL_TABLET | ORAL | 3 refills | Status: DC

## 2020-07-13 NOTE — Assessment & Plan Note (Signed)
Colonoscopy UTD, due in 2025 for repeat.

## 2020-07-13 NOTE — Assessment & Plan Note (Signed)
Well controlled in the office today, continue off medications. Continue to monitor.

## 2020-07-13 NOTE — Assessment & Plan Note (Signed)
Repeat lipid panel pending.  Discussed the importance of a healthy diet and regular exercise in order for weight loss, and to reduce the risk of any potential medical problems.

## 2020-07-13 NOTE — Assessment & Plan Note (Signed)
Noted to right posterior trunk at upper thoracic region. Near scapula. Non tender.  Discussed this with patient, the lipoma is not bothersome. He will monitor and will also see dermatology in early 2022.

## 2020-07-13 NOTE — Patient Instructions (Addendum)
Stop by the lab and xray prior to leaving today. I will notify you of your results once received.   Ensure you are consuming 64 ounces of water daily.  We will be in touch in 2 days regarding your urine culture results.   Your prostate feels enlarged, this may be contributing to your urinary symptoms.   The spot on your back feels like a benign cyst/lipoma.   Please notify me if your headaches become worse or do not resolve.   It was a pleasure to see you today!   Preventive Care 59-29 Years Old, Male Preventive care refers to lifestyle choices and visits with your health care provider that can promote health and wellness. This includes:  A yearly physical exam. This is also called an annual well check.  Regular dental and eye exams.  Immunizations.  Screening for certain conditions.  Healthy lifestyle choices, such as eating a healthy diet, getting regular exercise, not using drugs or products that contain nicotine and tobacco, and limiting alcohol use. What can I expect for my preventive care visit? Physical exam Your health care provider will check:  Height and weight. These may be used to calculate body mass index (BMI), which is a measurement that tells if you are at a healthy weight.  Heart rate and blood pressure.  Your skin for abnormal spots. Counseling Your health care provider may ask you questions about:  Alcohol, tobacco, and drug use.  Emotional well-being.  Home and relationship well-being.  Sexual activity.  Eating habits.  Work and work Statistician. What immunizations do I need?  Influenza (flu) vaccine  This is recommended every year. Tetanus, diphtheria, and pertussis (Tdap) vaccine  You may need a Td booster every 10 years. Varicella (chickenpox) vaccine  You may need this vaccine if you have not already been vaccinated. Zoster (shingles) vaccine  You may need this after age 59. Measles, mumps, and rubella (MMR) vaccine  You may  need at least one dose of MMR if you were born in 1957 or later. You may also need a second dose. Pneumococcal conjugate (PCV13) vaccine  You may need this if you have certain conditions and were not previously vaccinated. Pneumococcal polysaccharide (PPSV23) vaccine  You may need one or two doses if you smoke cigarettes or if you have certain conditions. Meningococcal conjugate (MenACWY) vaccine  You may need this if you have certain conditions. Hepatitis A vaccine  You may need this if you have certain conditions or if you travel or work in places where you may be exposed to hepatitis A. Hepatitis B vaccine  You may need this if you have certain conditions or if you travel or work in places where you may be exposed to hepatitis B. Haemophilus influenzae type b (Hib) vaccine  You may need this if you have certain risk factors. Human papillomavirus (HPV) vaccine  If recommended by your health care provider, you may need three doses over 6 months. You may receive vaccines as individual doses or as more than one vaccine together in one shot (combination vaccines). Talk with your health care provider about the risks and benefits of combination vaccines. What tests do I need? Blood tests  Lipid and cholesterol levels. These may be checked every 5 years, or more frequently if you are over 59 years old.  Hepatitis C test.  Hepatitis B test. Screening  Lung cancer screening. You may have this screening every year starting at age 59 if you have a 30-pack-year history of  smoking and currently smoke or have quit within the past 15 years.  Prostate cancer screening. Recommendations will vary depending on your family history and other risks.  Colorectal cancer screening. All adults should have this screening starting at age 59 and continuing until age 93. Your health care provider may recommend screening at age 41 if you are at increased risk. You will have tests every 1-10 years, depending  on your results and the type of screening test.  Diabetes screening. This is done by checking your blood sugar (glucose) after you have not eaten for a while (fasting). You may have this done every 1-3 years.  Sexually transmitted disease (STD) testing. Follow these instructions at home: Eating and drinking  Eat a diet that includes fresh fruits and vegetables, whole grains, lean protein, and low-fat dairy products.  Take vitamin and mineral supplements as recommended by your health care provider.  Do not drink alcohol if your health care provider tells you not to drink.  If you drink alcohol: ? Limit how much you have to 0-2 drinks a day. ? Be aware of how much alcohol is in your drink. In the [place] one drink equals one 12 oz bottle of beer (355 mL), one 5 oz glass of wine (148 mL), or one 1 oz glass of hard liquor (44 mL). Lifestyle  Take daily care of your teeth and gums.  Stay active. Exercise for at least 30 minutes on 5 or more days each week.  Do not use any products that contain nicotine or tobacco, such as cigarettes, e-cigarettes, and chewing tobacco. If you need help quitting, ask your health care provider.  If you are sexually active, practice safe sex. Use a condom or other form of protection to prevent STIs (sexually transmitted infections).  Talk with your health care provider about taking a low-dose aspirin every day starting at age 94. What's next?  Go to your health care provider once a year for a well check visit.  Ask your health care provider how often you should have your eyes and teeth checked.  Stay up to date on all vaccines. This information is not intended to replace advice given to you by your health care provider. Make sure you discuss any questions you have with your health care provider. Document Revised: 07/17/2018 Document Reviewed: 07/17/2018 Elsevier Patient Education  2020 Elsevier Inc.       Influenza (Flu) Vaccine (Inactivated or  Recombinant): What You Need to Know 1. Why get vaccinated? Influenza vaccine can prevent influenza (flu). Flu is a contagious disease that spreads around the [place] every year, usually between October and May. Anyone can get the flu, but it is more dangerous for some people. Infants and young children, people 49 years of age and older, pregnant women, and people with certain health conditions or a weakened immune system are at greatest risk of flu complications. Pneumonia, bronchitis, sinus infections and ear infections are examples of flu-related complications. If you have a medical condition, such as heart disease, cancer or diabetes, flu can make it worse. Flu can cause fever and chills, sore throat, muscle aches, fatigue, cough, headache, and runny or stuffy nose. Some people may have vomiting and diarrhea, though this is more common in children than adults. Each year thousands of people in the [place] die from flu, and many more are hospitalized. Flu vaccine prevents millions of illnesses and flu-related visits to the doctor each year. 2. Influenza vaccine CDC recommends everyone 6 months of  age and older get vaccinated every flu season. Children 6 months through 45 years of age may need 2 doses during a single flu season. Everyone else needs only 1 dose each flu season. It takes about 2 weeks for protection to develop after vaccination. There are many flu viruses, and they are always changing. Each year a new flu vaccine is made to protect against three or four viruses that are likely to cause disease in the upcoming flu season. Even when the vaccine doesn't exactly match these viruses, it may still provide some protection. Influenza vaccine does not cause flu. Influenza vaccine may be given at the same time as other vaccines. 3. Talk with your health care provider Tell your vaccine provider if the person getting the vaccine:  Has had an allergic reaction after a previous dose of  influenza vaccine, or has any severe, life-threatening allergies.  Has ever had Guillain-Barr Syndrome (also called GBS). In some cases, your health care provider may decide to postpone influenza vaccination to a future visit. People with minor illnesses, such as a cold, may be vaccinated. People who are moderately or severely ill should usually wait until they recover before getting influenza vaccine. Your health care provider can give you more information. 4. Risks of a vaccine reaction  Soreness, redness, and swelling where shot is given, fever, muscle aches, and headache can happen after influenza vaccine.  There may be a very small increased risk of Guillain-Barr Syndrome (GBS) after inactivated influenza vaccine (the flu shot). Young children who get the flu shot along with pneumococcal vaccine (PCV13), and/or DTaP vaccine at the same time might be slightly more likely to have a seizure caused by fever. Tell your health care provider if a child who is getting flu vaccine has ever had a seizure. People sometimes faint after medical procedures, including vaccination. Tell your provider if you feel dizzy or have vision changes or ringing in the ears. As with any medicine, there is a very remote chance of a vaccine causing a severe allergic reaction, other serious injury, or death. 5. What if there is a serious problem? An allergic reaction could occur after the vaccinated person leaves the clinic. If you see signs of a severe allergic reaction (hives, swelling of the face and throat, difficulty breathing, a fast heartbeat, dizziness, or weakness), call 9-1-1 and get the person to the nearest hospital. For other signs that concern you, call your health care provider. Adverse reactions should be reported to the Vaccine Adverse Event Reporting System (VAERS). Your health care provider will usually file this report, or you can do it yourself. Visit the VAERS website at www.vaers.SamedayNews.es or call  (916)812-4965.VAERS is only for reporting reactions, and VAERS staff do not give medical advice. 6. The National Vaccine Injury Compensation Program The [employer] Vaccine Injury Compensation Program (VICP) is a federal program that was created to compensate people who may have been injured by certain vaccines. Visit the VICP website at GoldCloset.com.ee or call 240-534-7757 to learn about the program and about filing a claim. There is a time limit to file a claim for compensation. 7. How can I learn more?  Ask your healthcare provider.  Call your local or state health department.  Contact the Centers for Disease Control and Prevention (CDC): ? Call (629) 285-2060 (1-800-CDC-INFO) or ? Visit CDC's https://gibson.com/ Vaccine Information Statement (Interim) Inactivated Influenza Vaccine (03/20/2018) This information is not intended to replace advice given to you by your health care provider. Make sure you discuss any  questions you have with your health care provider. Document Revised: 11/11/2018 Document Reviewed: 03/24/2018 Elsevier Patient Education  [place].

## 2020-07-13 NOTE — Assessment & Plan Note (Signed)
Influenza vaccination provided today. PSA UTD, pending today. Colonoscopy UTD, due in 2025.  Discussed the importance of a healthy diet and regular exercise in order for weight loss, and to reduce the risk of any potential medical problems.  Exam today as noted. Labs pending.

## 2020-07-13 NOTE — Assessment & Plan Note (Addendum)
Acute since October 2021. Prostate exam today with enlargement, chaperone present.  UA today with 3+ leuks, otherwise negative. No blood. Urine culture pending. Labs pending to rule out metabolic cause.  Discussed potential treatment for BPH with medication, await lab results first.

## 2020-07-13 NOTE — Assessment & Plan Note (Signed)
Doing well on daily famotidine 20 mg. Continue same.

## 2020-07-13 NOTE — Progress Notes (Signed)
Subjective:    Patient ID: Daniel Giles, male    DOB: Apr 26, 1961, 59 y.o.   MRN: [id number]  HPI  This visit occurred during the SARS-CoV-2 public health emergency.  Safety protocols were in place, including screening questions prior to the visit, additional usage of staff PPE, and extensive cleaning of exam room while observing appropriate contact time as indicated for disinfecting solutions.   Daniel Giles is a 59 year old male who presents today for complete physical. He would also like to discuss a few acute and chronic issues.   1) Headaches/Neck Pain: He struck his right parietal and occipital head onto a tree three weeks ago when his dog jumped on him. After his dog jumped on him he fell backwards and hit is head against the tree. At the time he "saw stars" and was unable to ambulate immediately due to dizziness. After a few minutes of rest he was able to ambulate to the house. Since then he's noticed intermittent headaches to the right parietal lobe and constant right neck pain to the base of the skull. He's been taking Ibuprofen with improvement in headaches and neck pain. Overall he's better, but still notices symptoms.   He denies "worst headache" of his life, dizziness, confusion. His neck is sore with right sided movement and extension. He denies upper extremity radiculopathy.   2) Nocturia/Urinary Frequency/Urgency: Also with foul smelling urine, dysuria, inside urethral itching that has been present since 05/08/20, two days after receiving MAB treatment for Covid-19. Two days after he received MAB treatment for Covid-19 he experienced difficulty urinating and dysuria for about 24 hours, this resolved after 24 hours. He received MAB received treatment on 05/06/20. Now he's experiencing frequent urination during the day with urgency, urinating once every hour. Also getting up at night 3 times on average to urinate. Continues with dysuria and itching to inside of penis. His father had an  enlarged prostate, denies cancer.   3) Cyst/Lipoma: He would also to discuss a "knot" to his right thoracic back for which he's noticed for about 6 months. Gradually has noticed enlargement of the spot to his back. History of skin cancer, follows with Dermatology and is due in early 2022. This mass does not bother him.  Immunizations: -Tetanus: Completed in 2019 -Influenza: Due today -Shingles: Completed  -Covid-19: Completed series  Diet: He endorses a healthy diet.  Exercise: He is active, no regular exercise.  Eye exam: Completes annually  Dental exam: Completes semi-annually   Colonoscopy: Completed in 2020, due in 2025 PSA: 0.2 in 2020 Hep C Screen: Negative  BP Readings from Last 3 Encounters:  07/13/20 123/74  05/06/20 121/81  07/09/19 118/75     Review of Systems  Constitutional: Negative for fever and unexpected weight change.  HENT: Negative for rhinorrhea.   Eyes: Negative for visual disturbance.  Respiratory: Negative for cough and shortness of breath.   Cardiovascular: Negative for chest pain.  Gastrointestinal: Negative for constipation and diarrhea.  Endocrine: Positive for polyuria. Negative for polydipsia and polyphagia.  Genitourinary: Positive for dysuria, frequency and urgency. Negative for difficulty urinating and discharge.  Musculoskeletal: Positive for arthralgias, myalgias and neck pain.  Skin: Negative for rash.  Allergic/Immunologic: Negative for environmental allergies.  Neurological: Positive for headaches. Negative for dizziness and numbness.  Psychiatric/Behavioral: The patient is not nervous/anxious.        Past Medical History:  Diagnosis Date  . Essential hypertension   . GERD (gastroesophageal reflux disease)   . Hx  of adenomatous colonic polyps 07/23/2019  . Seasonal allergies   . Sleep apnea    does not use CPAP     Social History   Socioeconomic History  . Marital status: Married    Spouse name: Not on file  . Number of  children: Not on file  . Years of education: Not on file  . Highest education level: Not on file  Occupational History    Employer: [employer]  Tobacco Use  . Smoking status: Never Smoker  . Smokeless tobacco: Never Used  Vaping Use  . Vaping Use: Never used  Substance and Sexual Activity  . Alcohol use: No    Alcohol/week: 0.0 standard drinks  . Drug use: No  . Sexual activity: Not on file  Other Topics Concern  . Not on file  Social History Narrative   Married.   2 children, 4 grandchildren.   Works as a Dealer.   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   Social Determinants of Health   Financial Resource Strain:   . Difficulty of Paying Living Expenses: Not on file  Food Insecurity:   . Worried About Charity fundraiser in the Last Year: Not on file  . Ran Out of Food in the Last Year: Not on file  Transportation Needs:   . Lack of Transportation (Medical): Not on file  . Lack of Transportation (Non-Medical): Not on file  Physical Activity:   . Days of Exercise per Week: Not on file  . Minutes of Exercise per Session: Not on file  Stress:   . Feeling of Stress : Not on file  Social Connections:   . Frequency of Communication with Friends and Family: Not on file  . Frequency of Social Gatherings with Friends and Family: Not on file  . Attends Religious Services: Not on file  . Active Member of Clubs or Organizations: Not on file  . Attends Archivist Meetings: Not on file  . Marital Status: Not on file  Intimate Partner Violence:   . Fear of Current or Ex-Partner: Not on file  . Emotionally Abused: Not on file  . Physically Abused: Not on file  . Sexually Abused: Not on file    Past Surgical History:  Procedure Laterality Date  . COLONOSCOPY  2008  . KNEE SURGERY Bilateral   . LIPOMA EXCISION     oral cavity  . ROTATOR CUFF REPAIR Right     Family History  Problem Relation Age of Onset  . Hypertension Father   .  Colon polyps Father   . Stroke Paternal Grandmother   . Diabetes Brother        Type 1  . Colon cancer Neg Hx   . Esophageal cancer Neg Hx   . Rectal cancer Neg Hx   . Stomach cancer Neg Hx     Allergies  Allergen Reactions  . Codeine Itching  . Other Itching    Darvocet     Current Outpatient Medications on File Prior to Visit  Medication Sig Dispense Refill  . diphenhydrAMINE HCl, Sleep, (QC E Z NITE SLEEP PO) Take by mouth.    . famotidine (PEPCID) 20 MG tablet TAKE 1 TABLET BY MOUTH TWICE DAILY FOR HEARTBURN 180 tablet 1   No current facility-administered medications on file prior to visit.    BP 123/74   Pulse 86   Temp 98.1 F (36.7 C) (Temporal)   Ht 5\' 10"  (1.778 m)   Wt 184 lb (  83.5 kg)   SpO2 97%   BMI 26.40 kg/m    Objective:   Physical Exam Exam conducted with a chaperone present.  HENT:     Right Ear: Tympanic membrane and ear canal normal.     Left Ear: Tympanic membrane and ear canal normal.  Eyes:     Pupils: Pupils are equal, round, and reactive to light.  Cardiovascular:     Rate and Rhythm: Normal rate and regular rhythm.  Pulmonary:     Effort: Pulmonary effort is normal.     Breath sounds: Normal breath sounds.  Abdominal:     General: Bowel sounds are normal.     Palpations: Abdomen is soft.     Tenderness: There is no abdominal tenderness.  Genitourinary:    Prostate: Enlarged. Not tender.     Rectum: No tenderness.  Musculoskeletal:        General: Normal range of motion.     Cervical back: Normal range of motion and neck supple. Pain with movement present. No spinous process tenderness or muscular tenderness.  Skin:    General: Skin is warm and dry.          Comments: 5 cm rounded, soft, , non tender, raised, flesh colored subcutaneous mass to right posterior trunk at scapular region. Appears to be lipoma/cyst.  Neurological:     Mental Status: He is alert and oriented to person, place, and time.     Cranial Nerves: No cranial  nerve deficit.     Deep Tendon Reflexes:     Reflex Scores:      Patellar reflexes are 2+ on the right side and 2+ on the left side. Psychiatric:        Mood and Affect: Mood normal.            Assessment & Plan:

## 2020-07-13 NOTE — Assessment & Plan Note (Signed)
Acute since trauma three weeks ago. Overall normal ROM, point tenderness.   Checking plain films today given trauma. Discussed conservative treatment.

## 2020-07-14 LAB — HIV ANTIBODY (ROUTINE TESTING W REFLEX): HIV 1&2 Ab, 4th Generation: NONREACTIVE

## 2020-07-15 LAB — URINE CULTURE
MICRO NUMBER:: 11292578
SPECIMEN QUALITY:: ADEQUATE

## 2020-07-17 ENCOUNTER — Other Ambulatory Visit: Payer: Self-pay | Admitting: Primary Care

## 2020-07-17 DIAGNOSIS — N3000 Acute cystitis without hematuria: Secondary | ICD-10-CM

## 2020-07-17 MED ORDER — NITROFURANTOIN MONOHYD MACRO 100 MG PO CAPS: 100.0000 mg | ORAL_CAPSULE | Freq: Two times a day (BID) | ORAL | 0 refills | Status: DC

## 2020-07-18 ENCOUNTER — Telehealth: Payer: Self-pay | Admitting: Primary Care

## 2020-07-18 NOTE — Telephone Encounter (Signed)
Error

## 2020-07-18 NOTE — Telephone Encounter (Signed)
Patient states he received a call from [place]. No  Note in the system of who called. EM

## 2020-07-19 DIAGNOSIS — N3000 Acute cystitis without hematuria: Secondary | ICD-10-CM

## 2020-07-19 MED ORDER — AMOXICILLIN-POT CLAVULANATE 875-125 MG PO TABS: 1.0000 | ORAL_TABLET | Freq: Two times a day (BID) | ORAL | 0 refills | Status: DC

## 2020-08-01 ENCOUNTER — Other Ambulatory Visit: Payer: Self-pay

## 2020-08-01 ENCOUNTER — Other Ambulatory Visit: Payer: BC Managed Care – PPO

## 2020-08-01 DIAGNOSIS — R3589 Other polyuria: Secondary | ICD-10-CM

## 2020-08-01 LAB — POC URINALSYSI DIPSTICK (AUTOMATED)
Bilirubin, UA: NEGATIVE
Glucose, UA: NEGATIVE
Ketones, UA: NEGATIVE
Leukocytes, UA: NEGATIVE
Nitrite, UA: NEGATIVE
Protein, UA: NEGATIVE
Spec Grav, UA: 1.025 (ref 1.010–1.025)
Urobilinogen, UA: 0.2 E.U./dL
pH, UA: 5.5 (ref 5.0–8.0)

## 2020-08-01 NOTE — Progress Notes (Signed)
po

## 2020-08-02 LAB — URINE CULTURE
MICRO NUMBER:: 11358268
Result:: NO GROWTH
SPECIMEN QUALITY:: ADEQUATE

## 2021-05-02 ENCOUNTER — Other Ambulatory Visit: Payer: Self-pay

## 2021-05-02 ENCOUNTER — Encounter: Payer: Self-pay | Admitting: Primary Care

## 2021-05-02 ENCOUNTER — Ambulatory Visit (INDEPENDENT_AMBULATORY_CARE_PROVIDER_SITE_OTHER): Payer: BC Managed Care – PPO | Admitting: Primary Care

## 2021-05-02 VITALS — BP 124/72 | HR 53 | Temp 97.9°F | Ht 70.0 in | Wt 182.0 lb

## 2021-05-02 DIAGNOSIS — Z0000 Encounter for general adult medical examination without abnormal findings: Secondary | ICD-10-CM

## 2021-05-02 DIAGNOSIS — Z23 Encounter for immunization: Secondary | ICD-10-CM

## 2021-05-02 DIAGNOSIS — I10 Essential (primary) hypertension: Secondary | ICD-10-CM | POA: Diagnosis not present

## 2021-05-02 DIAGNOSIS — K219 Gastro-esophageal reflux disease without esophagitis: Secondary | ICD-10-CM

## 2021-05-02 DIAGNOSIS — Z125 Encounter for screening for malignant neoplasm of prostate: Secondary | ICD-10-CM

## 2021-05-02 DIAGNOSIS — E785 Hyperlipidemia, unspecified: Secondary | ICD-10-CM | POA: Diagnosis not present

## 2021-05-02 DIAGNOSIS — R7303 Prediabetes: Secondary | ICD-10-CM | POA: Diagnosis not present

## 2021-05-02 DIAGNOSIS — Z86010 Personal history of colonic polyps: Secondary | ICD-10-CM

## 2021-05-02 LAB — LIPID PANEL
Cholesterol: 196 mg/dL (ref 0–200)
HDL: 39.9 mg/dL (ref >39.00)
LDL Cholesterol: 141 mg/dL — ABNORMAL HIGH (ref 0–99)
NonHDL: 156.46
Total CHOL/HDL Ratio: 5
Triglycerides: 78 mg/dL (ref 0.0–149.0)
VLDL: 15.6 mg/dL (ref 0.0–40.0)

## 2021-05-02 LAB — COMPREHENSIVE METABOLIC PANEL
ALT: 19 U/L (ref 0–53)
AST: 17 U/L (ref 0–37)
Albumin: 4.5 g/dL (ref 3.5–5.2)
Alkaline Phosphatase: 57 U/L (ref 39–117)
BUN: 16 mg/dL (ref 6–23)
CO2: 29 mEq/L (ref 19–32)
Calcium: 9.6 mg/dL (ref 8.4–10.5)
Chloride: 105 mEq/L (ref 96–112)
Creatinine, Ser: 1.2 mg/dL (ref 0.40–1.50)
GFR: 65.63 mL/min (ref >60.00)
Glucose, Bld: 102 mg/dL — ABNORMAL HIGH (ref 70–99)
Potassium: 4.8 mEq/L (ref 3.5–5.1)
Sodium: 141 mEq/L (ref 135–145)
Total Bilirubin: 0.9 mg/dL (ref 0.2–1.2)
Total Protein: 6.9 g/dL (ref 6.0–8.3)

## 2021-05-02 LAB — CBC
HCT: 43.5 % (ref 39.0–52.0)
Hemoglobin: 14.8 g/dL (ref 13.0–17.0)
MCHC: 33.9 g/dL (ref 30.0–36.0)
MCV: 90.1 fl (ref 78.0–100.0)
Platelets: 161 10*3/uL (ref 150.0–400.0)
RBC: 4.83 Mil/uL (ref 4.22–5.81)
RDW: 13.3 % (ref 11.5–15.5)
WBC: 5.2 10*3/uL (ref 4.0–10.5)

## 2021-05-02 LAB — PSA: PSA: 0.56 ng/mL (ref 0.10–4.00)

## 2021-05-02 LAB — HEMOGLOBIN A1C: Hgb A1c MFr Bld: 5.9 % (ref 4.6–6.5)

## 2021-05-02 NOTE — Assessment & Plan Note (Signed)
Colonoscopy UTD, due in 2025. 

## 2021-05-02 NOTE — Assessment & Plan Note (Signed)
Immunizations UTD. PSA due.  Discussed the importance of a healthy diet and regular exercise in order for weight loss, and to reduce the risk of further co-morbidity.  Exam today stable. Labs pending.

## 2021-05-02 NOTE — Patient Instructions (Signed)
Stop by the lab prior to leaving today. I will notify you of your results once received.   It was a pleasure to see you today!  Preventive Care 41-60 Years Old, Male Preventive care refers to lifestyle choices and visits with your health care provider that can promote health and wellness. This includes: A yearly physical exam. This is also called an annual wellness visit. Regular dental and eye exams. Immunizations. Screening for certain conditions. Healthy lifestyle choices, such as: Eating a healthy diet. Getting regular exercise. Not using drugs or products that contain nicotine and tobacco. Limiting alcohol use. What can I expect for my preventive care visit? Physical exam Your health care provider will check your: Height and weight. These may be used to calculate your BMI (body mass index). BMI is a measurement that tells if you are at a healthy weight. Heart rate and blood pressure. Body temperature. Skin for abnormal spots. Counseling Your health care provider may ask you questions about your: Past medical problems. Family's medical history. Alcohol, tobacco, and drug use. Emotional well-being. Home life and relationship well-being. Sexual activity. Diet, exercise, and sleep habits. Work and work Statistician. Access to firearms. What immunizations do I need? Vaccines are usually given at various ages, according to a schedule. Your health care provider will recommend vaccines for you based on your age, medical history, and lifestyle or other factors, such as travel or where you work. What tests do I need? Blood tests Lipid and cholesterol levels. These may be checked every 5 years, or more often if you are over 60 years old. Hepatitis C test. Hepatitis B test. Screening Lung cancer screening. You may have this screening every year starting at age 12 if you have a 30-pack-year history of smoking and currently smoke or have quit within the past 15 years. Prostate cancer  screening. Recommendations will vary depending on your family history and other risks. Genital exam to check for testicular cancer or hernias. Colorectal cancer screening. All adults should have this screening starting at age 60 and continuing until age 60. Your health care provider may recommend screening at age 13 if you are at increased risk. You will have tests every 1-10 years, depending on your results and the type of screening test. Diabetes screening. This is done by checking your blood sugar (glucose) after you have not eaten for a while (fasting). You may have this done every 1-3 years. STD (sexually transmitted disease) testing, if you are at risk. Follow these instructions at home: Eating and drinking  Eat a diet that includes fresh fruits and vegetables, whole grains, lean protein, and low-fat dairy products. Take vitamin and mineral supplements as recommended by your health care provider. Do not drink alcohol if your health care provider tells you not to drink. If you drink alcohol: Limit how much you have to 0-2 drinks a day. Be aware of how much alcohol is in your drink. In the [place] one drink equals one 12 oz bottle of beer (355 mL), one 5 oz glass of wine (148 mL), or one 1 oz glass of hard liquor (44 mL). Lifestyle Take daily care of your teeth and gums. Brush your teeth every morning and night with fluoride toothpaste. Floss one time each day. Stay active. Exercise for at least 30 minutes 5 or more days each week. Do not use any products that contain nicotine or tobacco, such as cigarettes, e-cigarettes, and chewing tobacco. If you need help quitting, ask your health care provider. Do not  use drugs. If you are sexually active, practice safe sex. Use a condom or other form of protection to prevent STIs (sexually transmitted infections). If told by your health care provider, take low-dose aspirin daily starting at age 10. Find healthy ways to cope with stress, such  as: Meditation, yoga, or listening to music. Journaling. Talking to a trusted person. Spending time with friends and family. Safety Always wear your seat belt while driving or riding in a vehicle. Do not drive: If you have been drinking alcohol. Do not ride with someone who has been drinking. When you are tired or distracted. While texting. Wear a helmet and other protective equipment during sports activities. If you have firearms in your house, make sure you follow all gun safety procedures. What's next? Go to your health care provider once a year for an annual wellness visit. Ask your health care provider how often you should have your eyes and teeth checked. Stay up to date on all vaccines. This information is not intended to replace advice given to you by your health care provider. Make sure you discuss any questions you have with your health care provider. Document Revised: 09/30/2020 Document Reviewed: 07/17/2018 Elsevier Patient Education  2022 [employer].

## 2021-05-02 NOTE — Progress Notes (Signed)
Subjective:    Patient ID: Daniel Giles, male    DOB: 21-Mar-1961, 60 y.o.   MRN: [id number]  HPI  Daniel Giles is a very pleasant 60 y.o. male who presents today for complete physical and follow up of chronic conditions.  Immunizations: -Tetanus: 2019 -Influenza: Due today -Covid-19: 2 vaccines -Shingles: Shingrix  Diet: Fair diet.  Exercise: No regular exercise, active   Eye exam: Completes annually  Dental exam: Completes semi-annually    Colonoscopy: Completed in 2020, due 2025 PSA: Due  BP Readings from Last 3 Encounters:  05/02/21 124/72  07/13/20 123/74  05/06/20 121/81        Review of Systems  Constitutional:  Negative for unexpected weight change.  HENT:  Negative for rhinorrhea.   Respiratory:  Negative for cough and shortness of breath.   Cardiovascular:  Negative for chest pain.  Gastrointestinal:  Negative for constipation and diarrhea.  Genitourinary:  Negative for difficulty urinating.  Musculoskeletal:  Negative for arthralgias and myalgias.  Skin:  Negative for rash.  Allergic/Immunologic: Negative for environmental allergies.  Neurological:  Negative for dizziness, numbness and headaches.  Psychiatric/Behavioral:  The patient is not nervous/anxious.         Past Medical History:  Diagnosis Date   Acute neck pain 07/13/2020   Essential hypertension    GERD (gastroesophageal reflux disease)    Hx of adenomatous colonic polyps 07/23/2019   Nocturia 07/13/2020   Seasonal allergies    Sleep apnea    does not use CPAP    Social History   Socioeconomic History   Marital status: Married    Spouse name: Not on file   Number of children: Not on file   Years of education: Not on file   Highest education level: Not on file  Occupational History    Employer: [employer]  Tobacco Use   Smoking status: Never   Smokeless tobacco: Never  Vaping Use   Vaping Use: Never used  Substance and Sexual Activity   Alcohol use: No     Alcohol/week: 0.0 standard drinks   Drug use: No   Sexual activity: Not on file  Other Topics Concern   Not on file  Social History Narrative   Married.   2 children, 4 grandchildren.   Works as a Dealer.   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   Social Determinants of Health   Financial Resource Strain: Not on file  Food Insecurity: Not on file  Transportation Needs: Not on file  Physical Activity: Not on file  Stress: Not on file  Social Connections: Not on file  Intimate Partner Violence: Not on file    Past Surgical History:  Procedure Laterality Date   COLONOSCOPY  2008   KNEE SURGERY Bilateral    LIPOMA EXCISION     oral cavity   ROTATOR CUFF REPAIR Right     Family History  Problem Relation Age of Onset   Hypertension Father    Colon polyps Father    Stroke Paternal Grandmother    Diabetes Brother        Type 1   Colon cancer Neg Hx    Esophageal cancer Neg Hx    Rectal cancer Neg Hx    Stomach cancer Neg Hx     Allergies  Allergen Reactions   Codeine Itching   Macrobid [Nitrofurantoin]     Patient's family members are allergic.    Other Itching    Darvocet  Current Outpatient Medications on File Prior to Visit  Medication Sig Dispense Refill   diphenhydrAMINE HCl, Sleep, (QC E Z NITE SLEEP PO) Take by mouth.     famotidine (PEPCID) 20 MG tablet TAKE 1 TABLET BY MOUTH TWICE DAILY FOR HEARTBURN 180 tablet 3   No current facility-administered medications on file prior to visit.    BP 124/72   Pulse (!) 53   Temp 97.9 F (36.6 C) (Temporal)   Ht 5\' 10"  (1.778 m)   Wt 182 lb (82.6 kg)   SpO2 95%   BMI 26.11 kg/m  Objective:   Physical Exam HENT:     Right Ear: Tympanic membrane and ear canal normal.     Left Ear: Tympanic membrane and ear canal normal.     Nose: Nose normal.     Right Sinus: No maxillary sinus tenderness or frontal sinus tenderness.     Left Sinus: No maxillary sinus tenderness or frontal sinus  tenderness.  Eyes:     Conjunctiva/sclera: Conjunctivae normal.  Neck:     Thyroid: No thyromegaly.     Vascular: No carotid bruit.  Cardiovascular:     Rate and Rhythm: Normal rate and regular rhythm.     Heart sounds: Normal heart sounds.  Pulmonary:     Effort: Pulmonary effort is normal.     Breath sounds: Normal breath sounds. No wheezing or rales.  Abdominal:     General: Bowel sounds are normal.     Palpations: Abdomen is soft.     Tenderness: There is no abdominal tenderness.  Musculoskeletal:        General: Normal range of motion.     Cervical back: Neck supple.  Skin:    General: Skin is warm and dry.  Neurological:     Mental Status: He is alert and oriented to person, place, and time.     Cranial Nerves: No cranial nerve deficit.     Deep Tendon Reflexes: Reflexes are normal and symmetric.  Psychiatric:        Mood and Affect: Mood normal.          Assessment & Plan:      This visit occurred during the SARS-CoV-2 public health emergency.  Safety protocols were in place, including screening questions prior to the visit, additional usage of staff PPE, and extensive cleaning of exam room while observing appropriate contact time as indicated for disinfecting solutions.

## 2021-05-02 NOTE — Assessment & Plan Note (Signed)
Doing well on famotide 20 mg once to twice daily. Continue same.

## 2021-05-02 NOTE — Assessment & Plan Note (Signed)
Repeat A1C pending. 

## 2021-05-02 NOTE — Assessment & Plan Note (Signed)
Repeat lipid panel pending. 

## 2021-05-02 NOTE — Assessment & Plan Note (Signed)
Well controlled in the office today. Continue off treatment.

## 2021-05-03 DIAGNOSIS — Z23 Encounter for immunization: Secondary | ICD-10-CM

## 2021-05-03 IMAGING — DX DG CERVICAL SPINE 2 OR 3 VIEWS
4 series · 4 of 4 positions shown · non-contrast
Comparison: None.

CLINICAL DATA: Acute right-sided neck pain since head injury 3
weeks ago.

EXAM:
CERVICAL SPINE - 2-3 VIEW

[c-spine lat]
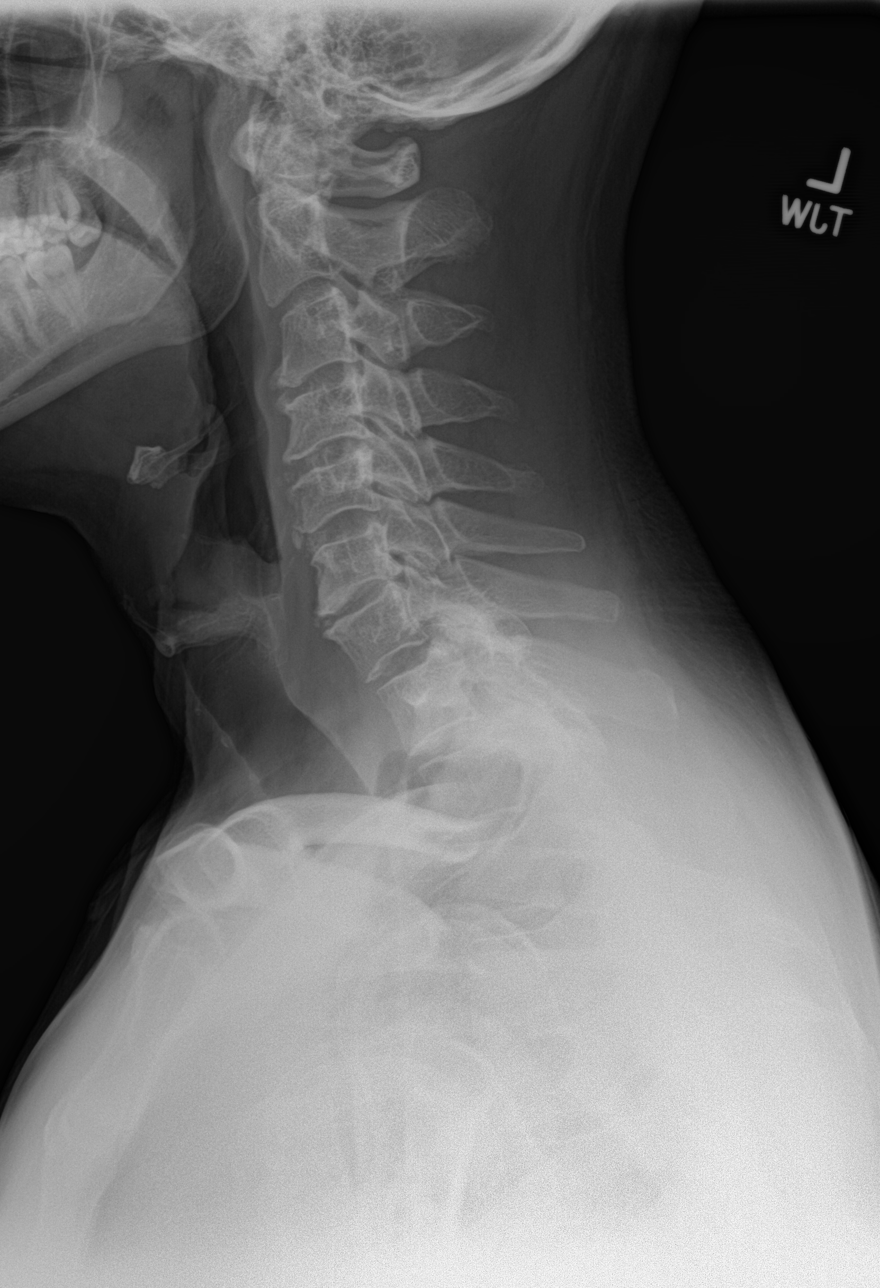

[c-spine ap]
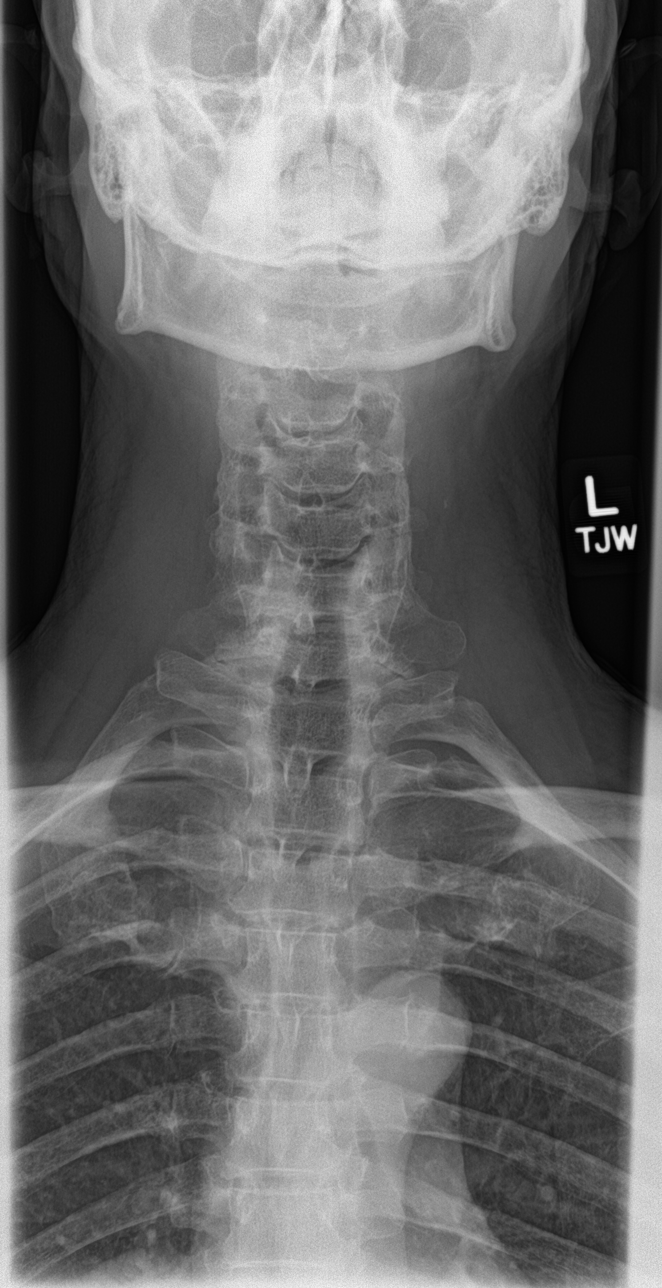

[c-spine open mouth]
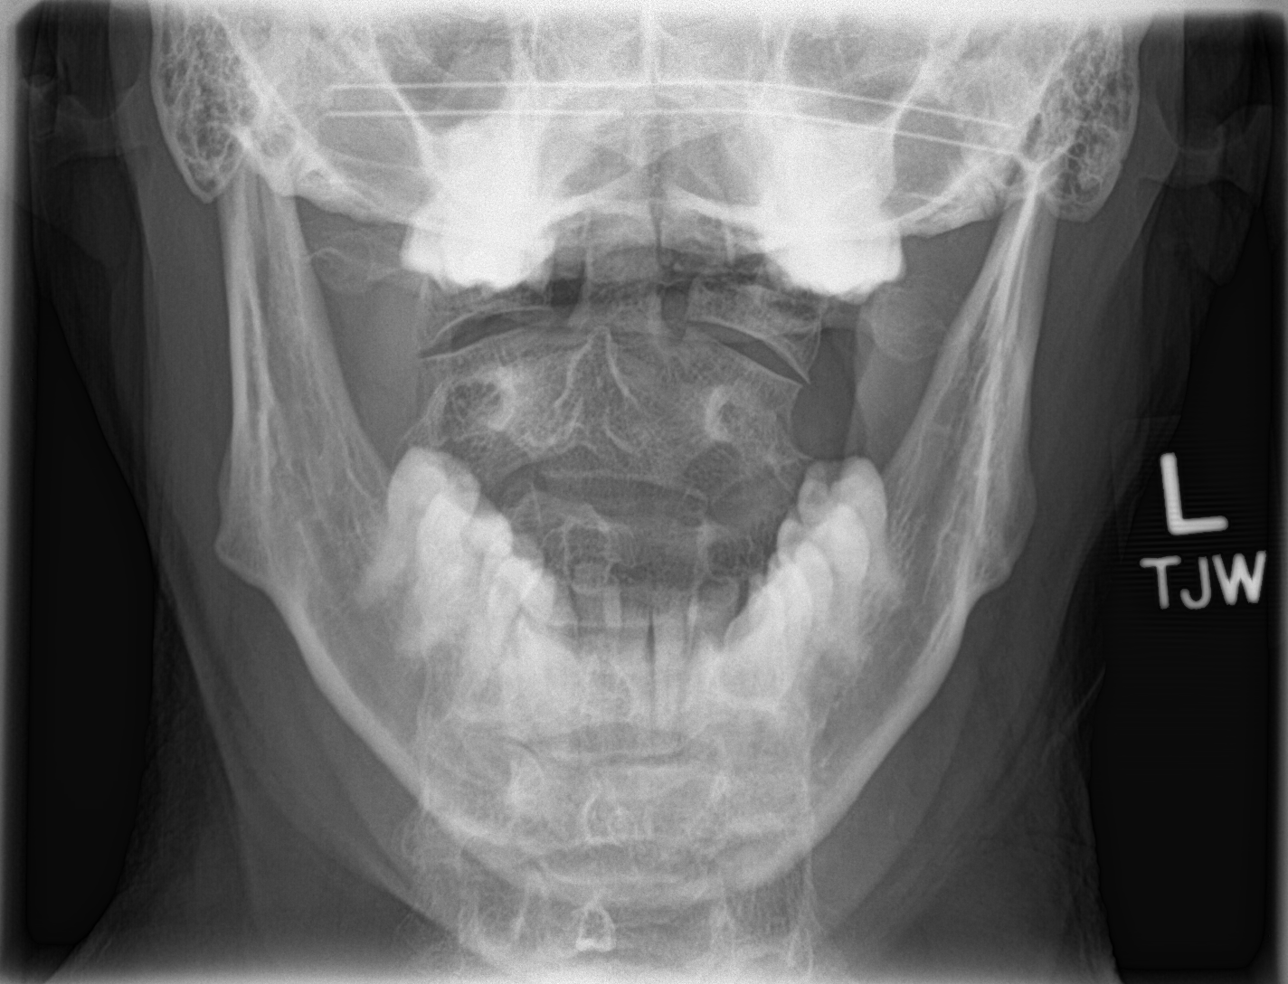

[c-spine swimmers]
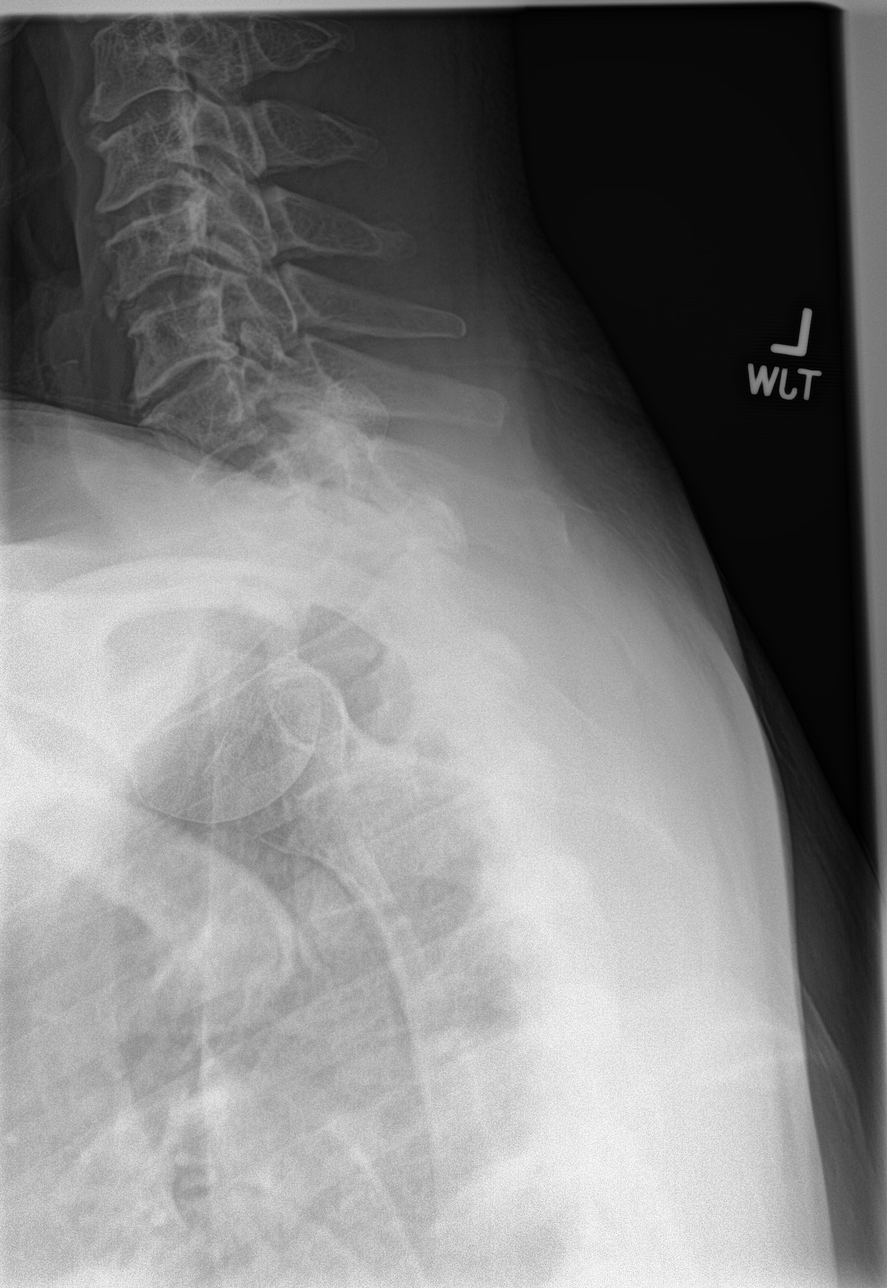

[4 of 4 positions shown; findings below may reference images not displayed]

FINDINGS: The prevertebral soft tissues are normal. The alignment is anatomic
through T1 aside from mild straightening. There is no evidence of
acute fracture or traumatic subluxation. Mild multilevel spondylosis
is present with disc space narrowing, uncinate spurring and facet
hypertrophy. The C1-2 articulation appears normal in the AP
projection.
IMPRESSION: No evidence of acute cervical spine fracture, traumatic subluxation
or static signs of instability. Mild spondylosis.

## 2021-09-26 ENCOUNTER — Other Ambulatory Visit: Payer: Self-pay

## 2021-09-26 ENCOUNTER — Ambulatory Visit (INDEPENDENT_AMBULATORY_CARE_PROVIDER_SITE_OTHER): Payer: BC Managed Care – PPO | Admitting: Primary Care

## 2021-09-26 ENCOUNTER — Encounter: Payer: Self-pay | Admitting: Primary Care

## 2021-09-26 VITALS — BP 126/82 | HR 81 | Temp 98.6°F | Ht 70.0 in | Wt 188.0 lb

## 2021-09-26 DIAGNOSIS — R051 Acute cough: Secondary | ICD-10-CM | POA: Insufficient documentation

## 2021-09-26 DIAGNOSIS — H6121 Impacted cerumen, right ear: Secondary | ICD-10-CM | POA: Diagnosis not present

## 2021-09-26 DIAGNOSIS — H6120 Impacted cerumen, unspecified ear: Secondary | ICD-10-CM | POA: Insufficient documentation

## 2021-09-26 HISTORY — DX: Impacted cerumen, unspecified ear: H61.20

## 2021-09-26 HISTORY — DX: Acute cough: R05.1

## 2021-09-26 MED ORDER — AMOXICILLIN-POT CLAVULANATE 875-125 MG PO TABS: 1.0000 | ORAL_TABLET | Freq: Two times a day (BID) | ORAL | 0 refills | Status: DC

## 2021-09-26 MED ORDER — BENZONATATE 200 MG PO CAPS: 200.0000 mg | ORAL_CAPSULE | Freq: Three times a day (TID) | ORAL | 0 refills | Status: DC | PRN

## 2021-09-26 NOTE — Assessment & Plan Note (Signed)
Noted to right canal on exam.  Patient consented to irrigation. Right canal irrigated, patient tolerated well.  TM and canal post irrigation unremarkable.

## 2021-09-26 NOTE — Patient Instructions (Signed)
Start Augmentin antibiotics for the infection Take 1 tablet by mouth twice daily for 7 days.  You may take Benzonatate capsules for cough. Take 1 capsule by mouth three times daily as needed for cough.  Start an allergy pill at bedtime such as Zyrtec.  Please update me if no improvement/resolving her symptoms  It was a pleasure to see you today!

## 2021-09-26 NOTE — Assessment & Plan Note (Signed)
Likely multifactorial including allergies and reflux.  Given the presence of purulent sputum and persistent symptoms, will treat with antibiotics.  Prescription for Augmentin course sent to pharmacy, twice daily x7 days.  Prescription for Tessalon Perles to use 3 times daily as needed sent to pharmacy.  We discussed the role of postnasal drip and esophageal reflux and recurrent cough.  He will continue famotidine 20 mg once to twice daily.  Discussed to start Zyrtec at night.  He will update if no improvement.

## 2021-09-26 NOTE — Progress Notes (Signed)
Subjective:    Patient ID: Daniel Giles, male    DOB: [birth year], 61 y.o.   MRN: [id number]  HPI  Daniel Giles is a very pleasant 61 y.o. male with a history of hypertension, GERD, hyperlipidemia, prediabetes who presents today to discuss cough.  Symptoms began two months ago with fevers, body aches, chills, vomiting, symptoms resolved 2-3 days later. Since then he's experienced a cough that occurs when talking, getting out of bed, sleeping at night.   Two weeks ago he developed nausea, vomiting, diarrhea which lasted 24 hours with resolve.   He continues with a cough, sometimes congested and sometimes dry. Two days ago he developed a congested cough, coughing up green mucous, blowing green mucous from his nasal cavity. Also with post nasal drip.   He has noticed esophageal burning, some belching. He takes famotidine 20 mg once and sometimes twice daily with improvement.  He's not taken anything OTC for symptoms. He denies fevers. He has never smoked.  He has tested negative for COVID.   Review of Systems  Constitutional:  Negative for chills, fatigue and fever.  HENT:  Positive for congestion and postnasal drip.   Respiratory:  Positive for cough. Negative for shortness of breath.   Neurological:  Positive for headaches.        Past Medical History:  Diagnosis Date   Acute neck pain 07/13/2020   Essential hypertension    GERD (gastroesophageal reflux disease)    Hx of adenomatous colonic polyps 07/23/2019   Nocturia 07/13/2020   Seasonal allergies    Sleep apnea    does not use CPAP    Social History   Socioeconomic History   Marital status: Married    Spouse name: Not on file   Number of children: Not on file   Years of education: Not on file   Highest education level: Not on file  Occupational History    Employer: [employer]  Tobacco Use   Smoking status: Never   Smokeless tobacco: Never  Vaping Use   Vaping Use: Never used  Substance and Sexual  Activity   Alcohol use: No    Alcohol/week: 0.0 standard drinks   Drug use: No   Sexual activity: Not on file  Other Topics Concern   Not on file  Social History Narrative   Married.   2 children, 4 grandchildren.   Works as a Dealer.   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   Social Determinants of Health   Financial Resource Strain: Not on file  Food Insecurity: Not on file  Transportation Needs: Not on file  Physical Activity: Not on file  Stress: Not on file  Social Connections: Not on file  Intimate Partner Violence: Not on file    Past Surgical History:  Procedure Laterality Date   COLONOSCOPY  2008   KNEE SURGERY Bilateral    LIPOMA EXCISION     oral cavity   ROTATOR CUFF REPAIR Right     Family History  Problem Relation Age of Onset   Hypertension Father    Colon polyps Father    Stroke Paternal Grandmother    Diabetes Brother        Type 1   Colon cancer Neg Hx    Esophageal cancer Neg Hx    Rectal cancer Neg Hx    Stomach cancer Neg Hx     Allergies  Allergen Reactions   Codeine Itching   Macrobid [Nitrofurantoin]     Patient's  family members are allergic.    Other Itching    Darvocet     Current Outpatient Medications on File Prior to Visit  Medication Sig Dispense Refill   diphenhydrAMINE HCl, Sleep, (QC E Z NITE SLEEP PO) Take by mouth.     famotidine (PEPCID) 20 MG tablet TAKE 1 TABLET BY MOUTH TWICE DAILY FOR HEARTBURN 180 tablet 3   No current facility-administered medications on file prior to visit.    BP 126/82    Pulse 81    Temp 98.6 F (37 C) (Temporal)    Ht 5\' 10"  (1.778 m)    Wt 188 lb (85.3 kg)    SpO2 98%    BMI 26.98 kg/m  Objective:   Physical Exam Constitutional:      Comments: Appears tired  HENT:     Right Ear: There is impacted cerumen.     Left Ear: Tympanic membrane and ear canal normal.     Nose: No mucosal edema.     Right Sinus: No maxillary sinus tenderness or frontal sinus  tenderness.     Left Sinus: No maxillary sinus tenderness or frontal sinus tenderness.  Eyes:     Conjunctiva/sclera: Conjunctivae normal.  Cardiovascular:     Rate and Rhythm: Normal rate and regular rhythm.  Pulmonary:     Effort: Pulmonary effort is normal.     Breath sounds: Normal breath sounds. No wheezing or rales.     Comments: Dry cough noted frequently throughout visit Musculoskeletal:     Cervical back: Neck supple.  Skin:    General: Skin is warm and dry.          Assessment & Plan:      This visit occurred during the SARS-CoV-2 public health emergency.  Safety protocols were in place, including screening questions prior to the visit, additional usage of staff PPE, and extensive cleaning of exam room while observing appropriate contact time as indicated for disinfecting solutions.

## 2022-01-06 ENCOUNTER — Other Ambulatory Visit: Payer: Self-pay | Admitting: Primary Care

## 2022-01-06 DIAGNOSIS — K219 Gastro-esophageal reflux disease without esophagitis: Secondary | ICD-10-CM

## 2022-05-08 ENCOUNTER — Encounter: Payer: Self-pay | Admitting: Primary Care

## 2022-05-08 ENCOUNTER — Ambulatory Visit (INDEPENDENT_AMBULATORY_CARE_PROVIDER_SITE_OTHER): Payer: BC Managed Care – PPO | Admitting: Primary Care

## 2022-05-08 VITALS — BP 136/84 | HR 52 | Temp 97.2°F | Ht 69.75 in | Wt 184.0 lb

## 2022-05-08 DIAGNOSIS — E785 Hyperlipidemia, unspecified: Secondary | ICD-10-CM

## 2022-05-08 DIAGNOSIS — Z125 Encounter for screening for malignant neoplasm of prostate: Secondary | ICD-10-CM | POA: Diagnosis not present

## 2022-05-08 DIAGNOSIS — Z23 Encounter for immunization: Secondary | ICD-10-CM

## 2022-05-08 DIAGNOSIS — D171 Benign lipomatous neoplasm of skin and subcutaneous tissue of trunk: Secondary | ICD-10-CM | POA: Diagnosis not present

## 2022-05-08 DIAGNOSIS — Z0000 Encounter for general adult medical examination without abnormal findings: Secondary | ICD-10-CM

## 2022-05-08 DIAGNOSIS — K219 Gastro-esophageal reflux disease without esophagitis: Secondary | ICD-10-CM

## 2022-05-08 DIAGNOSIS — I10 Essential (primary) hypertension: Secondary | ICD-10-CM | POA: Diagnosis not present

## 2022-05-08 DIAGNOSIS — R7303 Prediabetes: Secondary | ICD-10-CM | POA: Diagnosis not present

## 2022-05-08 LAB — COMPREHENSIVE METABOLIC PANEL
ALT: 18 U/L (ref 0–53)
AST: 19 U/L (ref 0–37)
Albumin: 4.3 g/dL (ref 3.5–5.2)
Alkaline Phosphatase: 54 U/L (ref 39–117)
BUN: 16 mg/dL (ref 6–23)
CO2: 28 mEq/L (ref 19–32)
Calcium: 9.1 mg/dL (ref 8.4–10.5)
Chloride: 105 mEq/L (ref 96–112)
Creatinine, Ser: 1.22 mg/dL (ref 0.40–1.50)
GFR: 63.89 mL/min (ref >60.00)
Glucose, Bld: 95 mg/dL (ref 70–99)
Potassium: 4.1 mEq/L (ref 3.5–5.1)
Sodium: 140 mEq/L (ref 135–145)
Total Bilirubin: 0.7 mg/dL (ref 0.2–1.2)
Total Protein: 6.7 g/dL (ref 6.0–8.3)

## 2022-05-08 LAB — LIPID PANEL
Cholesterol: 193 mg/dL (ref 0–200)
HDL: 42.2 mg/dL (ref >39.00)
LDL Cholesterol: 134 mg/dL — ABNORMAL HIGH (ref 0–99)
NonHDL: 150.93
Total CHOL/HDL Ratio: 5
Triglycerides: 85 mg/dL (ref 0.0–149.0)
VLDL: 17 mg/dL (ref 0.0–40.0)

## 2022-05-08 LAB — HEMOGLOBIN A1C: Hgb A1c MFr Bld: 6 % (ref 4.6–6.5)

## 2022-05-08 LAB — PSA: PSA: 0.31 ng/mL (ref 0.10–4.00)

## 2022-05-08 NOTE — Patient Instructions (Signed)
Stop by the lab prior to leaving today. I will notify you of your results once received.   Follow up with your dermatologist as discussed.  It was a pleasure to see you today!  Preventive Care 48-61 Years Old, Male Preventive care refers to lifestyle choices and visits with your health care provider that can promote health and wellness. Preventive care visits are also called wellness exams. What can I expect for my preventive care visit? Counseling During your preventive care visit, your health care provider may ask about your: Medical history, including: Past medical problems. Family medical history. Current health, including: Emotional well-being. Home life and relationship well-being. Sexual activity. Lifestyle, including: Alcohol, nicotine or tobacco, and drug use. Access to firearms. Diet, exercise, and sleep habits. Safety issues such as seatbelt and bike helmet use. Sunscreen use. Work and work Statistician. Physical exam Your health care provider will check your: Height and weight. These may be used to calculate your BMI (body mass index). BMI is a measurement that tells if you are at a healthy weight. Waist circumference. This measures the distance around your waistline. This measurement also tells if you are at a healthy weight and may help predict your risk of certain diseases, such as type 2 diabetes and high blood pressure. Heart rate and blood pressure. Body temperature. Skin for abnormal spots. What immunizations do I need?  Vaccines are usually given at various ages, according to a schedule. Your health care provider will recommend vaccines for you based on your age, medical history, and lifestyle or other factors, such as travel or where you work. What tests do I need? Screening Your health care provider may recommend screening tests for certain conditions. This may include: Lipid and cholesterol levels. Diabetes screening. This is done by checking your blood  sugar (glucose) after you have not eaten for a while (fasting). Hepatitis B test. Hepatitis C test. HIV (human immunodeficiency virus) test. STI (sexually transmitted infection) testing, if you are at risk. Lung cancer screening. Prostate cancer screening. Colorectal cancer screening. Talk with your health care provider about your test results, treatment options, and if necessary, the need for more tests. Follow these instructions at home: Eating and drinking  Eat a diet that includes fresh fruits and vegetables, whole grains, lean protein, and low-fat dairy products. Take vitamin and mineral supplements as recommended by your health care provider. Do not drink alcohol if your health care provider tells you not to drink. If you drink alcohol: Limit how much you have to 0-2 drinks a day. Know how much alcohol is in your drink. In the [place] one drink equals one 12 oz bottle of beer (355 mL), one 5 oz glass of wine (148 mL), or one 1 oz glass of hard liquor (44 mL). Lifestyle Brush your teeth every morning and night with fluoride toothpaste. Floss one time each day. Exercise for at least 30 minutes 5 or more days each week. Do not use any products that contain nicotine or tobacco. These products include cigarettes, chewing tobacco, and vaping devices, such as e-cigarettes. If you need help quitting, ask your health care provider. Do not use drugs. If you are sexually active, practice safe sex. Use a condom or other form of protection to prevent STIs. Take aspirin only as told by your health care provider. Make sure that you understand how much to take and what form to take. Work with your health care provider to find out whether it is safe and beneficial for you to  take aspirin daily. Find healthy ways to manage stress, such as: Meditation, yoga, or listening to music. Journaling. Talking to a trusted person. Spending time with friends and family. Minimize exposure to UV radiation to  reduce your risk of skin cancer. Safety Always wear your seat belt while driving or riding in a vehicle. Do not drive: If you have been drinking alcohol. Do not ride with someone who has been drinking. When you are tired or distracted. While texting. If you have been using any mind-altering substances or drugs. Wear a helmet and other protective equipment during sports activities. If you have firearms in your house, make sure you follow all gun safety procedures. What's next? Go to your health care provider once a year for an annual wellness visit. Ask your health care provider how often you should have your eyes and teeth checked. Stay up to date on all vaccines. This information is not intended to replace advice given to you by your health care provider. Make sure you discuss any questions you have with your health care provider. Document Revised: 01/18/2021 Document Reviewed: 01/18/2021 Elsevier Patient Education  [employer].

## 2022-05-08 NOTE — Assessment & Plan Note (Signed)
Noted again to right posterior trunk.   Offered soft tissue ultrasound for confirmation, he declines as he will see his dermatologist soon. He will notify if he changes his mind.

## 2022-05-08 NOTE — Assessment & Plan Note (Signed)
Discussed the importance of a healthy diet and regular exercise in order for weight loss, and to reduce the risk of further co-morbidity.  Repeat A1C pending today.

## 2022-05-08 NOTE — Assessment & Plan Note (Signed)
Controlled.  Continue famotidine 20 mg daily to twice daily.

## 2022-05-08 NOTE — Assessment & Plan Note (Signed)
Borderline high today.   He will work on lifestyle changes. Discussed to monitor BP at home.  Continue to monitor.

## 2022-05-08 NOTE — Assessment & Plan Note (Signed)
Immunizations UTD. Influenza vaccine provided today. PSA due and pending. Colonoscopy UTD, due 2025  Discussed the importance of a healthy diet and regular exercise in order for weight loss, and to reduce the risk of further co-morbidity.  Exam stable. Labs pending.  Follow up in 1 year for repeat physical.

## 2022-05-08 NOTE — Assessment & Plan Note (Signed)
Weight gain since last year.  Discussed the importance of a healthy diet and regular exercise in order for weight loss, and to reduce the risk of further co-morbidity.  Repeat lipid panel pending.

## 2022-05-08 NOTE — Progress Notes (Signed)
Subjective:    Patient ID: Daniel Giles, male    DOB: [birth year], 61 y.o.   MRN: [id number]  HPI  [name] is a very pleasant 61 y.o. male who presents today for complete physical and follow up of chronic conditions.  Immunizations: -Tetanus: 2019 -Influenza: Due today -Shingles: Completed Shingrix  Diet: Poor diet overall, has gained a few pounds. Exercise: No regular exercise.  Eye exam: Completes annually  Dental exam: Completes semi-annually   Colonoscopy: Completed in 2020, due 2025  PSA: Due  BP Readings from Last 3 Encounters:  05/08/22 136/84  09/26/21 126/82  05/02/21 124/72         Review of Systems  Constitutional:  Negative for unexpected weight change.  HENT:  Negative for rhinorrhea.   Respiratory:  Negative for cough and shortness of breath.   Cardiovascular:  Negative for chest pain.  Gastrointestinal:  Negative for constipation and diarrhea.  Genitourinary:  Negative for difficulty urinating.  Musculoskeletal:  Negative for arthralgias and myalgias.  Skin:  Negative for rash.       Chronic skin mass to right posterior trunk  Allergic/Immunologic: Negative for environmental allergies.  Neurological:  Negative for dizziness and headaches.  Psychiatric/Behavioral:  The patient is not nervous/anxious.          Past Medical History:  Diagnosis Date   Acute cough 09/26/2021   Acute neck pain 07/13/2020   Cerumen impaction 09/26/2021   Essential hypertension    GERD (gastroesophageal reflux disease)    Hx of adenomatous colonic polyps 07/23/2019   Nocturia 07/13/2020   Seasonal allergies    Sleep apnea    does not use CPAP    Social History   Socioeconomic History   Marital status: Married    Spouse name: Not on file   Number of children: Not on file   Years of education: Not on file   Highest education level: Not on file  Occupational History    Employer: [employer]  Tobacco Use   Smoking status: Never   Smokeless  tobacco: Never  Vaping Use   Vaping Use: Never used  Substance and Sexual Activity   Alcohol use: No    Alcohol/week: 0.0 standard drinks of alcohol   Drug use: No   Sexual activity: Not on file  Other Topics Concern   Not on file  Social History Narrative   Married.   2 children, 4 grandchildren.   Works as a Dealer.   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   Social Determinants of Health   Financial Resource Strain: Not on file  Food Insecurity: Not on file  Transportation Needs: Not on file  Physical Activity: Not on file  Stress: Not on file  Social Connections: Not on file  Intimate Partner Violence: Not on file    Past Surgical History:  Procedure Laterality Date   COLONOSCOPY  2008   KNEE SURGERY Bilateral    LIPOMA EXCISION     oral cavity   ROTATOR CUFF REPAIR Right     Family History  Problem Relation Age of Onset   Hypertension Father    Colon polyps Father    Stroke Paternal Grandmother    Diabetes Brother        Type 1   Colon cancer Neg Hx    Esophageal cancer Neg Hx    Rectal cancer Neg Hx    Stomach cancer Neg Hx     Allergies  Allergen Reactions   Codeine  Itching   Macrobid [Nitrofurantoin]     Patient's family members are allergic.    Other Itching    Darvocet     Current Outpatient Medications on File Prior to Visit  Medication Sig Dispense Refill   diphenhydrAMINE HCl, Sleep, (QC E Z NITE SLEEP PO) Take by mouth.     famotidine (PEPCID) 20 MG tablet TAKE 1 TABLET BY MOUTH TWICE DAILY FOR HEARTBURN 180 tablet 1   No current facility-administered medications on file prior to visit.    BP 136/84   Pulse (!) 52   Temp (!) 97.2 F (36.2 C) (Temporal)   Ht 5' 9.75" (1.772 m)   Wt 184 lb (83.5 kg)   SpO2 99%   BMI 26.59 kg/m  Objective:   Physical Exam HENT:     Right Ear: Tympanic membrane and ear canal normal.     Left Ear: Tympanic membrane and ear canal normal.     Nose: Nose normal.     Right  Sinus: No maxillary sinus tenderness or frontal sinus tenderness.     Left Sinus: No maxillary sinus tenderness or frontal sinus tenderness.  Eyes:     Conjunctiva/sclera: Conjunctivae normal.  Neck:     Thyroid: No thyromegaly.     Vascular: No carotid bruit.  Cardiovascular:     Rate and Rhythm: Normal rate and regular rhythm.     Heart sounds: Normal heart sounds.  Pulmonary:     Effort: Pulmonary effort is normal.     Breath sounds: Normal breath sounds. No wheezing or rales.  Abdominal:     General: Bowel sounds are normal.     Palpations: Abdomen is soft.     Tenderness: There is no abdominal tenderness.  Musculoskeletal:        General: Normal range of motion.     Cervical back: Neck supple.  Skin:    General: Skin is warm and dry.  Neurological:     Mental Status: He is alert and oriented to person, place, and time.     Cranial Nerves: No cranial nerve deficit.     Deep Tendon Reflexes: Reflexes are normal and symmetric.  Psychiatric:        Mood and Affect: Mood normal.           Assessment & Plan:   Problem List Items Addressed This Visit       Cardiovascular and Mediastinum   Essential hypertension    Borderline high today.   He will work on lifestyle changes. Discussed to monitor BP at home.  Continue to monitor.       Relevant Orders   Comprehensive metabolic panel     Digestive   GERD    Controlled.  Continue famotidine 20 mg daily to twice daily.         Other   Preventative health care - Primary    Immunizations UTD. Influenza vaccine provided today. PSA due and pending. Colonoscopy UTD, due 2025  Discussed the importance of a healthy diet and regular exercise in order for weight loss, and to reduce the risk of further co-morbidity.  Exam stable. Labs pending.  Follow up in 1 year for repeat physical.       Hyperlipidemia    Weight gain since last year.  Discussed the importance of a healthy diet and regular exercise in  order for weight loss, and to reduce the risk of further co-morbidity.  Repeat lipid panel pending.      Relevant Orders  Lipid panel   Lipoma    Noted again to right posterior trunk.   Offered soft tissue ultrasound for confirmation, he declines as he will see his dermatologist soon. He will notify if he changes his mind.      Prediabetes    Discussed the importance of a healthy diet and regular exercise in order for weight loss, and to reduce the risk of further co-morbidity.  Repeat A1C pending today.      Relevant Orders   Hemoglobin A1c   Other Visit Diagnoses     Screening for prostate cancer       Relevant Orders   PSA          Pleas Koch, NP

## 2022-10-24 ENCOUNTER — Other Ambulatory Visit: Payer: Self-pay | Admitting: Primary Care

## 2022-10-24 DIAGNOSIS — K219 Gastro-esophageal reflux disease without esophagitis: Secondary | ICD-10-CM

## 2023-04-16 ENCOUNTER — Other Ambulatory Visit: Payer: Self-pay | Admitting: Primary Care

## 2023-04-16 DIAGNOSIS — K219 Gastro-esophageal reflux disease without esophagitis: Secondary | ICD-10-CM

## 2023-05-10 ENCOUNTER — Encounter: Payer: Self-pay | Admitting: Primary Care

## 2023-05-10 ENCOUNTER — Ambulatory Visit (INDEPENDENT_AMBULATORY_CARE_PROVIDER_SITE_OTHER): Payer: 59 | Admitting: Primary Care

## 2023-05-10 VITALS — BP 138/84 | HR 57 | Temp 97.9°F | Ht 70.0 in | Wt 187.0 lb

## 2023-05-10 DIAGNOSIS — G8929 Other chronic pain: Secondary | ICD-10-CM

## 2023-05-10 DIAGNOSIS — Z0000 Encounter for general adult medical examination without abnormal findings: Secondary | ICD-10-CM | POA: Diagnosis not present

## 2023-05-10 DIAGNOSIS — R7303 Prediabetes: Secondary | ICD-10-CM | POA: Diagnosis not present

## 2023-05-10 DIAGNOSIS — E785 Hyperlipidemia, unspecified: Secondary | ICD-10-CM

## 2023-05-10 DIAGNOSIS — Z125 Encounter for screening for malignant neoplasm of prostate: Secondary | ICD-10-CM

## 2023-05-10 DIAGNOSIS — M545 Low back pain, unspecified: Secondary | ICD-10-CM | POA: Insufficient documentation

## 2023-05-10 DIAGNOSIS — I10 Essential (primary) hypertension: Secondary | ICD-10-CM | POA: Diagnosis not present

## 2023-05-10 DIAGNOSIS — K219 Gastro-esophageal reflux disease without esophagitis: Secondary | ICD-10-CM

## 2023-05-10 DIAGNOSIS — Z23 Encounter for immunization: Secondary | ICD-10-CM | POA: Diagnosis not present

## 2023-05-10 LAB — COMPREHENSIVE METABOLIC PANEL
ALT: 23 U/L (ref 0–53)
AST: 21 U/L (ref 0–37)
Albumin: 4.4 g/dL (ref 3.5–5.2)
Alkaline Phosphatase: 62 U/L (ref 39–117)
BUN: 14 mg/dL (ref 6–23)
CO2: 28 meq/L (ref 19–32)
Calcium: 9.1 mg/dL (ref 8.4–10.5)
Chloride: 105 meq/L (ref 96–112)
Creatinine, Ser: 1.21 mg/dL (ref 0.40–1.50)
GFR: 64.07 mL/min (ref >60.00)
Glucose, Bld: 98 mg/dL (ref 70–99)
Potassium: 4 meq/L (ref 3.5–5.1)
Sodium: 141 meq/L (ref 135–145)
Total Bilirubin: 0.9 mg/dL (ref 0.2–1.2)
Total Protein: 6.7 g/dL (ref 6.0–8.3)

## 2023-05-10 LAB — LIPID PANEL
Cholesterol: 198 mg/dL (ref 0–200)
HDL: 40.3 mg/dL (ref >39.00)
LDL Cholesterol: 136 mg/dL — ABNORMAL HIGH (ref 0–99)
NonHDL: 157.23
Total CHOL/HDL Ratio: 5
Triglycerides: 105 mg/dL (ref 0.0–149.0)
VLDL: 21 mg/dL (ref 0.0–40.0)

## 2023-05-10 LAB — CBC
HCT: 45 % (ref 39.0–52.0)
Hemoglobin: 14.8 g/dL (ref 13.0–17.0)
MCHC: 32.8 g/dL (ref 30.0–36.0)
MCV: 91.7 fL (ref 78.0–100.0)
Platelets: 174 10*3/uL (ref 150.0–400.0)
RBC: 4.91 Mil/uL (ref 4.22–5.81)
RDW: 13.5 % (ref 11.5–15.5)
WBC: 5.5 10*3/uL (ref 4.0–10.5)

## 2023-05-10 LAB — PSA: PSA: 0.38 ng/mL (ref 0.10–4.00)

## 2023-05-10 LAB — HEMOGLOBIN A1C: Hgb A1c MFr Bld: 5.8 % (ref 4.6–6.5)

## 2023-05-10 MED ORDER — FAMOTIDINE 20 MG PO TABS: 20.0000 mg | ORAL_TABLET | Freq: Every day | ORAL | 3 refills | Status: DC

## 2023-05-10 NOTE — Assessment & Plan Note (Signed)
Repeat lipid panel pending.  Discussed the importance of a healthy diet and regular exercise in order for weight loss, and to reduce the risk of further co-morbidity.  

## 2023-05-10 NOTE — Assessment & Plan Note (Signed)
Repeat A1C pending.  Discussed the importance of a healthy diet and regular exercise in order for weight loss, and to reduce the risk of further co-morbidity.  

## 2023-05-10 NOTE — Assessment & Plan Note (Signed)
Since traumatic fall in Spring 2025. Following with orthopedics who is monitoring.

## 2023-05-10 NOTE — Patient Instructions (Signed)
Stop by the lab prior to leaving today. I will notify you of your results once received.   It was a pleasure to see you today!  

## 2023-05-10 NOTE — Assessment & Plan Note (Signed)
Controlled.  Continue famotidine 20 mg daily.  Refill provided.

## 2023-05-10 NOTE — Progress Notes (Signed)
Subjective:    Patient ID: Daniel Giles, male    DOB: Aug 07, 1960, 62 y.o.   MRN: [id number]  HPI  Daniel Giles is a very pleasant 62 y.o. male who presents today for complete physical and follow up of chronic conditions.  Immunizations: -Tetanus: Completed in 2019 -Influenza: Influenza vaccine provided today.  -Shingles: Completed Shingrix series   Diet: Fair diet.  Exercise: No regular exercise.  Eye exam: Completes annually  Dental exam: Completes semi-annually    Colonoscopy: Completed in 2020, due 2025  PSA: Due   BP Readings from Last 3 Encounters:  05/10/23 138/84  05/08/22 136/84  09/26/21 126/82        Review of Systems  Constitutional:  Negative for unexpected weight change.  HENT:  Negative for rhinorrhea.   Respiratory:  Negative for cough and shortness of breath.   Cardiovascular:  Negative for chest pain.  Gastrointestinal:  Negative for constipation and diarrhea.  Genitourinary:  Negative for difficulty urinating.  Musculoskeletal:  Negative for arthralgias and myalgias.  Skin:  Negative for rash.  Allergic/Immunologic: Negative for environmental allergies.  Neurological:  Negative for dizziness, numbness and headaches.  Psychiatric/Behavioral:  The patient is not nervous/anxious.          Past Medical History:  Diagnosis Date   Acute cough 09/26/2021   Acute neck pain 07/13/2020   Cerumen impaction 09/26/2021   Essential hypertension    GERD (gastroesophageal reflux disease)    Hx of adenomatous colonic polyps 07/23/2019   Nocturia 07/13/2020   Seasonal allergies    Sleep apnea    does not use CPAP    Social History   Socioeconomic History   Marital status: Married    Spouse name: Not on file   Number of children: Not on file   Years of education: Not on file   Highest education level: Not on file  Occupational History    Employer: [employer]  Tobacco Use   Smoking status: Never   Smokeless tobacco: Never  Vaping  Use   Vaping status: Never Used  Substance and Sexual Activity   Alcohol use: No    Alcohol/week: 0.0 standard drinks of alcohol   Drug use: No   Sexual activity: Not on file  Other Topics Concern   Not on file  Social History Narrative   Married.   2 children, 4 grandchildren.   Works as a Curator.   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   Social Determinants of Health   Financial Resource Strain: Not on file  Food Insecurity: Not on file  Transportation Needs: Not on file  Physical Activity: Not on file  Stress: Not on file  Social Connections: Not on file  Intimate Partner Violence: Not on file    Past Surgical History:  Procedure Laterality Date   COLONOSCOPY  2008   KNEE SURGERY Bilateral    LIPOMA EXCISION     oral cavity   ROTATOR CUFF REPAIR Right     Family History  Problem Relation Age of Onset   Hypertension Father    Colon polyps Father    Stroke Paternal Grandmother    Diabetes Brother        Type 1   Colon cancer Neg Hx    Esophageal cancer Neg Hx    Rectal cancer Neg Hx    Stomach cancer Neg Hx     Allergies  Allergen Reactions   Codeine Itching   Macrobid [Nitrofurantoin]  Patient's family members are allergic.    Other Itching    Darvocet     Current Outpatient Medications on File Prior to Visit  Medication Sig Dispense Refill   diphenhydrAMINE HCl, Sleep, (QC E Z NITE SLEEP PO) Take by mouth.     No current facility-administered medications on file prior to visit.    BP 138/84 (BP Location: Left Arm, Patient Position: Sitting, Cuff Size: Large)   Pulse (!) 57   Temp 97.9 F (36.6 C) (Oral)   Ht 5\' 10"  (1.778 m)   Wt 187 lb (84.8 kg)   SpO2 97%   BMI 26.83 kg/m  Objective:   Physical Exam HENT:     Right Ear: Tympanic membrane and ear canal normal.     Left Ear: Tympanic membrane and ear canal normal.  Eyes:     Pupils: Pupils are equal, round, and reactive to light.  Cardiovascular:     Rate  and Rhythm: Normal rate and regular rhythm.  Pulmonary:     Effort: Pulmonary effort is normal.     Breath sounds: Normal breath sounds.  Abdominal:     General: Bowel sounds are normal.     Palpations: Abdomen is soft.     Tenderness: There is no abdominal tenderness.  Musculoskeletal:        General: Normal range of motion.     Cervical back: Neck supple.  Skin:    General: Skin is warm and dry.  Neurological:     Mental Status: He is alert and oriented to person, place, and time.     Cranial Nerves: No cranial nerve deficit.     Deep Tendon Reflexes:     Reflex Scores:      Patellar reflexes are 2+ on the right side and 2+ on the left side. Psychiatric:        Mood and Affect: Mood normal.           Assessment & Plan:  Preventative health care Assessment & Plan: Immunizations UTD. Influenza vaccine provided today.  Colonoscopy UTD, due 2025 PSA due and pending.  Discussed the importance of a healthy diet and regular exercise in order for weight loss, and to reduce the risk of further co-morbidity.  Exam stable. Labs pending.  Follow up in 1 year for repeat physical.    Gastroesophageal reflux disease -     Famotidine; Take 1 tablet (20 mg total) by mouth daily. for heartburn.  Dispense: 90 tablet; Refill: 3  Essential hypertension Assessment & Plan: Borderline high.  Encouraged regular exercise. Discussed to work on diet.   Continue off treatment. Continue to monitor.   Orders: -     Comprehensive metabolic panel -     CBC  Gastroesophageal reflux disease, unspecified whether esophagitis present Assessment & Plan: Controlled.  Continue famotidine 20 mg daily.  Refill provided.    Hyperlipidemia, unspecified hyperlipidemia type Assessment & Plan: Repeat lipid panel pending.  Discussed the importance of a healthy diet and regular exercise in order for weight loss, and to reduce the risk of further co-morbidity.   Orders: -     Lipid  panel  Prediabetes Assessment & Plan: Repeat A1C pending.  Discussed the importance of a healthy diet and regular exercise in order for weight loss, and to reduce the risk of further co-morbidity.   Orders: -     Hemoglobin A1c  Chronic midline low back pain without sciatica Assessment & Plan: Since traumatic fall in Spring 2025. Following with  orthopedics who is monitoring.     Screening for prostate cancer -     PSA        Doreene Nest, NP

## 2023-05-10 NOTE — Assessment & Plan Note (Signed)
Borderline high.  Encouraged regular exercise. Discussed to work on diet.   Continue off treatment. Continue to monitor.

## 2023-05-10 NOTE — Assessment & Plan Note (Signed)
Immunizations UTD. Influenza vaccine provided today. Colonoscopy UTD, due 2025. PSA due and pending.  Discussed the importance of a healthy diet and regular exercise in order for weight loss, and to reduce the risk of further co-morbidity.  Exam stable. Labs pending.  Follow up in 1 year for repeat physical.

## 2023-05-27 ENCOUNTER — Ambulatory Visit: Payer: 59 | Admitting: Dermatology

## 2023-05-27 ENCOUNTER — Encounter: Payer: Self-pay | Admitting: Dermatology

## 2023-05-27 VITALS — BP 120/80

## 2023-05-27 DIAGNOSIS — Z5111 Encounter for antineoplastic chemotherapy: Secondary | ICD-10-CM

## 2023-05-27 DIAGNOSIS — L814 Other melanin hyperpigmentation: Secondary | ICD-10-CM | POA: Diagnosis not present

## 2023-05-27 DIAGNOSIS — Z1283 Encounter for screening for malignant neoplasm of skin: Secondary | ICD-10-CM | POA: Diagnosis not present

## 2023-05-27 DIAGNOSIS — L820 Inflamed seborrheic keratosis: Secondary | ICD-10-CM

## 2023-05-27 DIAGNOSIS — D229 Melanocytic nevi, unspecified: Secondary | ICD-10-CM

## 2023-05-27 DIAGNOSIS — L821 Other seborrheic keratosis: Secondary | ICD-10-CM

## 2023-05-27 DIAGNOSIS — L578 Other skin changes due to chronic exposure to nonionizing radiation: Secondary | ICD-10-CM

## 2023-05-27 DIAGNOSIS — L570 Actinic keratosis: Secondary | ICD-10-CM

## 2023-05-27 DIAGNOSIS — W908XXA Exposure to other nonionizing radiation, initial encounter: Secondary | ICD-10-CM | POA: Diagnosis not present

## 2023-05-27 MED ORDER — FLUOROURACIL 5 % EX CREA: TOPICAL_CREAM | Freq: Two times a day (BID) | CUTANEOUS | 0 refills | Status: AC

## 2023-05-27 NOTE — Patient Instructions (Signed)
5-Fluorouracil Patient Education   Actinic keratoses are the dry, red scaly spots on the skin caused by sun damage. A portion of these spots can turn into skin cancer with time, and treating them can help prevent development of skin cancer.   Treatment of these spots requires removal of the defective skin cells. There are various ways to remove actinic keratoses, including freezing with liquid nitrogen, treatment with creams, or treatment with a blue light procedure in the office.   5-fluorouracil cream is a topical cream used to treat actinic keratoses. It works by interfering with the growth of abnormal fast-growing skin cells, such as actinic keratoses. These cells peel off and are replaced by healthy ones. THIS CREAM SHOULD BE KEPT OUT OF REACH OF CHILDREN AND PETS AND SHOULD NOT BE USED BY PREGNANT WOMEN.  INSTRUCTIONS FOR 5-FLUOROURACIL CREAM:   5-fluorouracil cream typically needs to be used for 7-14 days depending on the location of treatment. A thin layer should be applied twice a day to the treatment areas recommended by your physician.    Avoid contact with your eyes or nostrils. Avoid applying the cream to your eyelids or lips unless directed to apply there by your physician. Do not use 5-fluorouracil on infected or open wounds.   You will develop redness, irritation and some crusting at areas where you have pre-cancer damage/actinic keratoses. IF YOU DEVELOP PAIN, BLEEDING, OR SIGNIFICANT CRUSTING, STOP THE TREATMENT EARLY - you have already gotten a good response and the actinic keratoses should clear up well.  Wash your hands after applying 5-fluorouracil 5% cream on your skin.   A moisturizer or sunscreen with a minimum SPF 30 should be applied each morning.   Once you have finished the treatment, you can apply a thin layer of Vaseline twice a day to irritated areas to soothe and calm the areas more quickly. If you experience significant discomfort, contact your physician.  For  some patients it is necessary to repeat the treatment for best results.  SIDE EFFECTS: When using 5-fluorouracil cream, you may have mild irritation, such as redness, dryness, swelling, or a mild burning sensation. This usually resolves within 2 weeks. The more actinic keratoses you have, the more redness and inflammation you can expect during treatment. Eye irritation has been reported rarely. If this occurs, please let us know.   If you have any trouble using this cream, please send [place] a MyChart Message or call the office. If you have any other questions about this information, please do not hesitate to ask me before you leave the office or reach out on MyChart or by phone.

## 2023-05-27 NOTE — Progress Notes (Signed)
New Patient Visit   Subjective  Daniel Giles is a 62 y.o. male who presents for the following: Skin Cancer Screening and Full Body Skin Exam  The patient presents for Total-Body Skin Exam (TBSE) for skin cancer screening and mole check. The patient has spots, moles and lesions to be evaluated, some may be new or changing.   The following portions of the chart were reviewed this encounter and updated as appropriate: medications, allergies, medical history  Review of Systems:  No other skin or systemic complaints except as noted in HPI or Assessment and Plan.  Objective  Well appearing patient in no apparent distress; mood and affect are within normal limits.  A full examination was performed including scalp, head, eyes, ears, nose, lips, neck, chest, axillae, abdomen, back, buttocks, bilateral upper extremities, bilateral lower extremities, hands, feet, fingers, toes, fingernails, and toenails. All findings within normal limits unless otherwise noted below.   Relevant physical exam findings are noted in the Assessment and Plan.  Mid Back Inflamed stuck-on verrucous, tan-brown papules and plaques.   Left Anterior Neck Erythematous thin papules/macules with gritty scale.    Assessment & Plan   SKIN CANCER SCREENING PERFORMED TODAY.  ACTINIC DAMAGE - Chronic condition, secondary to cumulative UV/sun exposure - diffuse scaly erythematous macules with underlying dyspigmentation - Recommend daily broad spectrum sunscreen SPF 30+ to sun-exposed areas, reapply every 2 hours as needed.  - Staying in the shade or wearing long sleeves, sun glasses (UVA+UVB protection) and wide brim hats (4-inch brim around the entire circumference of the hat) are also recommended for sun protection.  - Call for new or changing lesions.  LENTIGINES, SEBORRHEIC KERATOSES Benign normal skin lesions - Benign-appearing - Call for any changes  MELANOCYTIC NEVI - Tan-brown and/or pink-flesh-colored  symmetric macules and papules - Benign appearing on exam today - Observation - Call clinic for new or changing moles - Recommend daily use of broad spectrum spf 30+ sunscreen to sun-exposed areas.   ACTINIC KERATOSIS- face Exam: Erythematous thin papules/macules with gritty scale   Actinic keratoses are precancerous spots that appear secondary to cumulative UV radiation exposure/sun exposure over time. They are chronic with expected duration over 1 year. A portion of actinic keratoses will progress to squamous cell carcinoma of the skin. It is not possible to reliably predict which spots will progress to skin cancer and so treatment is recommended to prevent development of skin cancer.  Recommend daily broad spectrum sunscreen SPF 30+ to sun-exposed areas, reapply every 2 hours as needed.  Recommend staying in the shade or wearing long sleeves, sun glasses (UVA+UVB protection) and wide brim hats (4-inch brim around the entire circumference of the hat). Call for new or changing lesions.  Treatment Plan: Start 5-fluorouracil cream twice a day for 14 days to affected areas including Cheeks and forehead.  Patient provided with handout reviewing treatment course and side effects and advised to call or message [place] on MyChart with any concerns.  Reviewed course of treatment and expected reaction.  Patient advised to expect inflammation and crusting and advised that erosions are possible.  Patient advised to be diligent with sun protection during and after treatment. Handout with details of how to apply medication and what to expect provided. Counseled to keep medication out of reach of children and pets.   Inflamed seborrheic keratosis Mid Back  Destruction of lesion - Mid Back  Destruction method: cryotherapy   Informed consent: discussed and consent obtained   Lesion destroyed using liquid  nitrogen: Yes   Region frozen until ice ball extended beyond lesion: Yes   Outcome: patient tolerated  procedure well with no complications   Post-procedure details: wound care instructions given    AK (actinic keratosis) Left Anterior Neck  Destruction of lesion - Left Anterior Neck  Destruction method: cryotherapy   Informed consent: discussed and consent obtained   Lesion destroyed using liquid nitrogen: Yes   Region frozen until ice ball extended beyond lesion: Yes   Outcome: patient tolerated procedure well with no complications   Post-procedure details: wound care instructions given     Return in about 2 months (around 07/27/2023).  I, Germaine Pomfret, CMA, am acting as scribe for Gwenith Daily, MD.   Documentation: I have reviewed the above documentation for accuracy and completeness, and I agree with the above.  Gwenith Daily, MD

## 2023-07-25 ENCOUNTER — Ambulatory Visit: Payer: 59 | Admitting: Dermatology

## 2023-08-13 ENCOUNTER — Encounter: Payer: Self-pay | Admitting: Dermatology

## 2023-08-13 ENCOUNTER — Ambulatory Visit (INDEPENDENT_AMBULATORY_CARE_PROVIDER_SITE_OTHER): Payer: 59 | Admitting: Dermatology

## 2023-08-13 VITALS — BP 135/77 | HR 62

## 2023-08-13 DIAGNOSIS — L821 Other seborrheic keratosis: Secondary | ICD-10-CM

## 2023-08-13 DIAGNOSIS — L570 Actinic keratosis: Secondary | ICD-10-CM | POA: Diagnosis not present

## 2023-08-13 DIAGNOSIS — W908XXD Exposure to other nonionizing radiation, subsequent encounter: Secondary | ICD-10-CM

## 2023-08-13 DIAGNOSIS — D492 Neoplasm of unspecified behavior of bone, soft tissue, and skin: Secondary | ICD-10-CM

## 2023-08-13 DIAGNOSIS — D485 Neoplasm of uncertain behavior of skin: Secondary | ICD-10-CM

## 2023-08-13 NOTE — Progress Notes (Signed)
 Follow-Up Visit   Subjective  Daniel Giles is a 63 y.o. male who presents for the following: Aks on face and neck Pt here for 2 mth follow up s/ 5 FU treatment. Pt did entire 2 wks of treatment and had a strong reaction. He finished at the end of November and has had no problems since.  He also has 2 spots of concern on his mid chest, that he noticed while he was treated his neck and face with the topical 5FU, present for at least 2 months.   The following portions of the chart were reviewed this encounter and updated as appropriate: medications, allergies, medical history  Review of Systems:  No other skin or systemic complaints except as noted in HPI or Assessment and Plan.  Objective  Well appearing patient in no apparent distress; mood and affect are within normal limits.  A focused examination was performed of the following areas: Face and neck  Relevant exam findings are noted in the Assessment and Plan.  Right mid chest Black brown 5 mm papule  Left mid chest Faint grey brown 8 mm papule  Assessment & Plan    ACTINIC KERATOSIS- improved s/p topical 5FU Exam: Erythematous thin papules/macules with gritty scale at the face, scalp, neck  Actinic keratoses are precancerous spots that appear secondary to cumulative UV radiation exposure/sun exposure over time. They are chronic with expected duration over 1 year. A portion of actinic keratoses will progress to squamous cell carcinoma of the skin. It is not possible to reliably predict which spots will progress to skin cancer and so treatment is recommended to prevent development of skin cancer.  Recommend daily broad spectrum sunscreen SPF 30+ to sun-exposed areas, reapply every 2 hours as needed.  Recommend staying in the shade or wearing long sleeves, sun glasses (UVA+UVB protection) and wide brim hats (4-inch brim around the entire circumference of the hat). Call for new or changing lesions.  NEOPLASM OF UNCERTAIN  BEHAVIOR OF SKIN (2) Right mid chest Skin / nail biopsy Type of biopsy: tangential   Timeout: patient name, date of birth, surgical site, and procedure verified   Procedure prep:  Patient was prepped and draped in usual sterile fashion Prep type:  Isopropyl alcohol Anesthesia: the lesion was anesthetized in a standard fashion   Anesthetic:  1% lidocaine w/ epinephrine  1-100,000 buffered w/ 8.4% NaHCO3 Instrument used: DermaBlade   Hemostasis achieved with: pressure and aluminum chloride   Outcome: patient tolerated procedure well   Post-procedure details: sterile dressing applied   Specimen 1 - Surgical pathology Differential Diagnosis: R/O NMSC vs MM  Check Margins: No Left mid chest Skin / nail biopsy Type of biopsy: tangential   Timeout: patient name, date of birth, surgical site, and procedure verified   Procedure prep:  Patient was prepped and draped in usual sterile fashion Anesthesia: the lesion was anesthetized in a standard fashion   Anesthetic:  1% lidocaine w/ epinephrine  1-100,000 buffered w/ 8.4% NaHCO3 Instrument used: DermaBlade   Hemostasis achieved with: pressure and aluminum chloride   Outcome: patient tolerated procedure well   Post-procedure details: sterile dressing applied and wound care instructions given   Dressing type: bandage and pressure dressing   Specimen 2 - Surgical pathology Differential Diagnosis: R/O NMSC vs MM  Check Margins: No  Return in about 6 months (around 02/10/2024) for UBSE.  I, Darice Smock, CMA, am acting as scribe for RUFUS CHRISTELLA HOLY, MD.   Documentation: I have reviewed the above documentation  for accuracy and completeness, and I agree with the above.  RUFUS CHRISTELLA HOLY, MD

## 2023-08-13 NOTE — Patient Instructions (Addendum)

## 2023-08-15 LAB — SURGICAL PATHOLOGY

## 2024-02-11 ENCOUNTER — Ambulatory Visit: Payer: 59 | Admitting: Dermatology

## 2024-05-12 ENCOUNTER — Ambulatory Visit (INDEPENDENT_AMBULATORY_CARE_PROVIDER_SITE_OTHER): Payer: 59 | Admitting: Primary Care

## 2024-05-12 ENCOUNTER — Encounter: Payer: Self-pay | Admitting: Primary Care

## 2024-05-12 ENCOUNTER — Ambulatory Visit: Payer: Self-pay | Admitting: Primary Care

## 2024-05-12 VITALS — BP 146/88 | HR 55 | Temp 97.7°F | Ht 70.0 in | Wt 191.0 lb

## 2024-05-12 DIAGNOSIS — Z125 Encounter for screening for malignant neoplasm of prostate: Secondary | ICD-10-CM | POA: Diagnosis not present

## 2024-05-12 DIAGNOSIS — Z23 Encounter for immunization: Secondary | ICD-10-CM

## 2024-05-12 DIAGNOSIS — R7303 Prediabetes: Secondary | ICD-10-CM

## 2024-05-12 DIAGNOSIS — E785 Hyperlipidemia, unspecified: Secondary | ICD-10-CM | POA: Diagnosis not present

## 2024-05-12 DIAGNOSIS — I10 Essential (primary) hypertension: Secondary | ICD-10-CM

## 2024-05-12 DIAGNOSIS — Z0000 Encounter for general adult medical examination without abnormal findings: Secondary | ICD-10-CM

## 2024-05-12 DIAGNOSIS — K219 Gastro-esophageal reflux disease without esophagitis: Secondary | ICD-10-CM | POA: Diagnosis not present

## 2024-05-12 LAB — LIPID PANEL
Cholesterol: 214 mg/dL — ABNORMAL HIGH (ref 0–200)
HDL: 36 mg/dL — ABNORMAL LOW (ref >39.00)
LDL Cholesterol: 152 mg/dL — ABNORMAL HIGH (ref 0–99)
NonHDL: 177.76
Total CHOL/HDL Ratio: 6
Triglycerides: 130 mg/dL (ref 0.0–149.0)
VLDL: 26 mg/dL (ref 0.0–40.0)

## 2024-05-12 LAB — COMPREHENSIVE METABOLIC PANEL WITH GFR
ALT: 20 U/L (ref 0–53)
AST: 19 U/L (ref 0–37)
Albumin: 4.6 g/dL (ref 3.5–5.2)
Alkaline Phosphatase: 58 U/L (ref 39–117)
BUN: 16 mg/dL (ref 6–23)
CO2: 29 meq/L (ref 19–32)
Calcium: 9.6 mg/dL (ref 8.4–10.5)
Chloride: 103 meq/L (ref 96–112)
Creatinine, Ser: 1.19 mg/dL (ref 0.40–1.50)
GFR: 64.9 mL/min (ref >60.00)
Glucose, Bld: 103 mg/dL — ABNORMAL HIGH (ref 70–99)
Potassium: 4.3 meq/L (ref 3.5–5.1)
Sodium: 141 meq/L (ref 135–145)
Total Bilirubin: 0.9 mg/dL (ref 0.2–1.2)
Total Protein: 6.9 g/dL (ref 6.0–8.3)

## 2024-05-12 LAB — PSA: PSA: 0.39 ng/mL (ref 0.10–4.00)

## 2024-05-12 LAB — HEMOGLOBIN A1C: Hgb A1c MFr Bld: 6 % (ref 4.6–6.5)

## 2024-05-12 MED ORDER — FAMOTIDINE 20 MG PO TABS: 20.0000 mg | ORAL_TABLET | Freq: Every day | ORAL | 3 refills | Status: AC

## 2024-05-12 NOTE — Assessment & Plan Note (Signed)
 Above goal today, also on recheck.  We will have him start monitoring blood pressure readings at home.  Discussed parameters. He will also work on lifestyle changes such as improve diet and exercise.  Continue to monitor.

## 2024-05-12 NOTE — Assessment & Plan Note (Addendum)
 Immunizations UTD. Influenza vaccine provided today.  Colonoscopy due in December 2025.  He is aware. PSA due and pending.  Discussed the importance of a healthy diet and regular exercise in order for weight loss, and to reduce the risk of further co-morbidity.  Exam stable. Labs pending.  Follow up in 1 year for repeat physical.

## 2024-05-12 NOTE — Assessment & Plan Note (Signed)
 No concerns today Continue famotidine  20 mg daily.

## 2024-05-12 NOTE — Progress Notes (Signed)
 Subjective:    Patient ID: Daniel Giles, male    DOB: Jun 15, 1961, 63 y.o.   MRN: [id number]  Daniel Giles is a very pleasant 63 y.o. male who presents today for complete physical and follow up of chronic conditions.  Immunizations: -Tetanus: Completed in 2019 -Influenza: Influenza vaccine provided today.  -Shingles: Completed Shingrix  series  Diet: Fair diet.  Exercise: No regular exercise.  Eye exam: Completes annually  Dental exam: Completes semi-annually    Colonoscopy: Completed in December 2020, due December 2025.   PSA: Due  BP Readings from Last 3 Encounters:  05/12/24 (!) 146/88  08/13/23 135/77  05/27/23 120/80   Wt Readings from Last 3 Encounters:  05/12/24 191 lb (86.6 kg)  05/10/23 187 lb (84.8 kg)  05/08/22 184 lb (83.5 kg)         Review of Systems  Constitutional:  Negative for unexpected weight change.  HENT:  Negative for rhinorrhea.   Respiratory:  Negative for cough and shortness of breath.   Cardiovascular:  Negative for chest pain.  Gastrointestinal:  Negative for constipation and diarrhea.  Genitourinary:  Negative for difficulty urinating.  Musculoskeletal:  Negative for arthralgias and myalgias.  Skin:  Negative for rash.  Allergic/Immunologic: Positive for environmental allergies.  Neurological:  Negative for dizziness, numbness and headaches.  Psychiatric/Behavioral:  The patient is not nervous/anxious.          Past Medical History:  Diagnosis Date   Acute cough 09/26/2021   Acute neck pain 07/13/2020   Cerumen impaction 09/26/2021   Essential hypertension    GERD (gastroesophageal reflux disease)    Hx of adenomatous colonic polyps 07/23/2019   Nocturia 07/13/2020   Seasonal allergies    Sleep apnea    does not use CPAP    Social History   Socioeconomic History   Marital status: Married    Spouse name: Not on file   Number of children: Not on file   Years of education: Not on file   Highest education level:  Some college, no degree  Occupational History    Employer: [employer]  Tobacco Use   Smoking status: Never   Smokeless tobacco: Never  Vaping Use   Vaping status: Never Used  Substance and Sexual Activity   Alcohol use: No    Alcohol/week: 0.0 standard drinks of alcohol   Drug use: No   Sexual activity: Not on file  Other Topics Concern   Not on file  Social History Narrative   Married.   2 children, 4 grandchildren.   Works as a Curator.   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   Social Drivers of Corporate investment banker Strain: Low Risk  (05/11/2024)   Overall Financial Resource Strain (CARDIA)    Difficulty of Paying Living Expenses: Not very hard  Food Insecurity: No Food Insecurity (05/11/2024)   Hunger Vital Sign    Worried About Running Out of Food in the Last Year: Never true    Ran Out of Food in the Last Year: Never true  Transportation Needs: No Transportation Needs (05/11/2024)   PRAPARE - Administrator, Civil Service (Medical): No    Lack of Transportation (Non-Medical): No  Physical Activity: Insufficiently Active (05/11/2024)   Exercise Vital Sign    Days of Exercise per Week: 3 days    Minutes of Exercise per Session: 10 min  Stress: No Stress Concern Present (05/11/2024)   [employer] of Occupational Health - Occupational  Stress Questionnaire    Feeling of Stress: Not at all  Social Connections: Socially Integrated (05/11/2024)   Social Connection and Isolation Panel    Frequency of Communication with Friends and Family: More than three times a week    Frequency of Social Gatherings with Friends and Family: Once a week    Attends Religious Services: More than 4 times per year    Active Member of [employer] or Organizations: Yes    Attends Banker Meetings: 1 to 4 times per year    Marital Status: Married  Catering manager Violence: Not on file    Past Surgical History:  Procedure Laterality Date    COLONOSCOPY  2008   KNEE SURGERY Bilateral    LIPOMA EXCISION     oral cavity   ROTATOR CUFF REPAIR Right     Family History  Problem Relation Age of Onset   Hypertension Father    Colon polyps Father    Stroke Paternal Grandmother    Diabetes Brother        Type 1   Colon cancer Neg Hx    Esophageal cancer Neg Hx    Rectal cancer Neg Hx    Stomach cancer Neg Hx     Allergies  Allergen Reactions   Codeine Itching   Macrobid  [Nitrofurantoin ]     Patient's family members are allergic.    Other Itching    Darvocet     Current Outpatient Medications on File Prior to Visit  Medication Sig Dispense Refill   diphenhydrAMINE  HCl, Sleep, (QC E Z NITE SLEEP PO) Take by mouth.     fluorouracil  (EFUDEX ) 5 % cream Apply topically 2 (two) times daily. Apply to cheeks and forehead twice a day for two weeks (Patient not taking: Reported on 05/12/2024) 40 g 0   No current facility-administered medications on file prior to visit.    BP (!) 146/88   Pulse (!) 55   Temp 97.7 F (36.5 C) (Temporal)   Ht 5' 10 (1.778 m)   Wt 191 lb (86.6 kg)   SpO2 97%   BMI 27.41 kg/m  Objective:   Physical Exam HENT:     Right Ear: Tympanic membrane and ear canal normal.     Left Ear: Tympanic membrane and ear canal normal.  Eyes:     Pupils: Pupils are equal, round, and reactive to light.  Cardiovascular:     Rate and Rhythm: Normal rate and regular rhythm.  Pulmonary:     Effort: Pulmonary effort is normal.     Breath sounds: Normal breath sounds.  Abdominal:     General: Bowel sounds are normal.     Palpations: Abdomen is soft.     Tenderness: There is no abdominal tenderness.  Musculoskeletal:        General: Normal range of motion.     Cervical back: Neck supple.  Skin:    General: Skin is warm and dry.  Neurological:     Mental Status: He is alert and oriented to person, place, and time.     Cranial Nerves: No cranial nerve deficit.     Deep Tendon Reflexes:     Reflex  Scores:      Patellar reflexes are 2+ on the right side and 2+ on the left side. Psychiatric:        Mood and Affect: Mood normal.     Physical Exam        Assessment & Plan:  Preventative health care Assessment &  Plan: Immunizations UTD. Influenza vaccine provided today.  Colonoscopy due in December 2025.  He is aware. PSA due and pending.  Discussed the importance of a healthy diet and regular exercise in order for weight loss, and to reduce the risk of further co-morbidity.  Exam stable. Labs pending.  Follow up in 1 year for repeat physical.    Gastroesophageal reflux disease, unspecified whether esophagitis present Assessment & Plan: No concerns today Continue famotidine  20 mg daily.   Essential hypertension Assessment & Plan: Above goal today, also on recheck.  We will have him start monitoring blood pressure readings at home.  Discussed parameters. He will also work on lifestyle changes such as improve diet and exercise.  Continue to monitor.   Hyperlipidemia, unspecified hyperlipidemia type Assessment & Plan: Repeat lipid panel pending.  Work on a healthy diet and regular exercise in order for weight loss, and to reduce the risk of further co-morbidity.   Orders: -     Comprehensive metabolic panel with GFR -     Lipid panel  Prediabetes Assessment & Plan: Repeat A1c pending.  Discussed to work on diet and exercise.  Orders: -     Hemoglobin A1c  Screening for prostate cancer -     PSA  Gastroesophageal reflux disease -     Famotidine ; Take 1 tablet (20 mg total) by mouth daily. for heartburn.  Dispense: 90 tablet; Refill: 3  Encounter for immunization -     Flu vaccine trivalent PF, 6mos and older(Flulaval,Afluria,Fluarix,Fluzone)    Assessment and Plan Assessment & Plan         Comer MARLA Gaskins, NP   History of Present Illness

## 2024-05-12 NOTE — Assessment & Plan Note (Signed)
 Repeat A1c pending.  Discussed to work on diet and exercise.

## 2024-05-12 NOTE — Assessment & Plan Note (Signed)
 Repeat lipid panel pending.  Work on a healthy diet and regular exercise in order for weight loss, and to reduce the risk of further co-morbidity.

## 2024-05-12 NOTE — Patient Instructions (Signed)
 Stop by the lab prior to leaving today. I will notify you of your results once received.   It was a pleasure to see you today!

## 2025-05-13 ENCOUNTER — Encounter: Admitting: Primary Care
